# Patient Record
Sex: Female | Born: 1955 | Race: Black or African American | Hispanic: No | Marital: Married | Smoking: Former smoker
Health system: Southern US, Community
[De-identification: ages and names within clinical notes are randomized; demographics above are authoritative.]

## PROBLEM LIST (undated history)

## (undated) DIAGNOSIS — I10 Essential (primary) hypertension: Secondary | ICD-10-CM

## (undated) DIAGNOSIS — M549 Dorsalgia, unspecified: Secondary | ICD-10-CM

---

## 2002-10-04 ENCOUNTER — Encounter: Admission: RE | Admit: 2002-10-04 | Discharge: 2002-10-04 | Payer: Self-pay | Admitting: Family Medicine

## 2002-10-04 ENCOUNTER — Encounter: Payer: Self-pay | Admitting: Family Medicine

## 2003-02-12 ENCOUNTER — Ambulatory Visit (HOSPITAL_BASED_OUTPATIENT_CLINIC_OR_DEPARTMENT_OTHER): Admission: RE | Admit: 2003-02-12 | Discharge: 2003-02-12 | Payer: Self-pay | Admitting: Neurology

## 2003-12-30 ENCOUNTER — Other Ambulatory Visit: Admission: RE | Admit: 2003-12-30 | Discharge: 2003-12-30 | Payer: Self-pay | Admitting: Family Medicine

## 2004-01-13 ENCOUNTER — Encounter: Admission: RE | Admit: 2004-01-13 | Discharge: 2004-01-13 | Payer: Self-pay | Admitting: Family Medicine

## 2006-05-08 ENCOUNTER — Other Ambulatory Visit: Admission: RE | Admit: 2006-05-08 | Discharge: 2006-05-08 | Payer: Self-pay | Admitting: Family Medicine

## 2006-05-30 ENCOUNTER — Encounter: Admission: RE | Admit: 2006-05-30 | Discharge: 2006-05-30 | Payer: Self-pay | Admitting: Family Medicine

## 2007-03-14 ENCOUNTER — Encounter: Admission: RE | Admit: 2007-03-14 | Discharge: 2007-03-14 | Payer: Self-pay | Admitting: Family Medicine

## 2008-04-24 ENCOUNTER — Encounter: Admission: RE | Admit: 2008-04-24 | Discharge: 2008-04-24 | Payer: Self-pay | Admitting: Family Medicine

## 2008-09-27 ENCOUNTER — Emergency Department (HOSPITAL_COMMUNITY): Admission: EM | Admit: 2008-09-27 | Discharge: 2008-09-27 | Payer: Self-pay | Admitting: Family Medicine

## 2010-01-22 ENCOUNTER — Encounter: Admission: RE | Admit: 2010-01-22 | Discharge: 2010-01-22 | Payer: Self-pay | Admitting: Family Medicine

## 2010-07-05 LAB — POCT URINALYSIS DIP (DEVICE)
Bilirubin Urine: NEGATIVE
Glucose, UA: NEGATIVE mg/dL
Ketones, ur: NEGATIVE mg/dL
Nitrite: NEGATIVE
Protein, ur: NEGATIVE mg/dL
Specific Gravity, Urine: <=1.005 (ref 1.005–1.030)
Urobilinogen, UA: 0.2 mg/dL (ref 0.0–1.0)
pH: 6 (ref 5.0–8.0)

## 2010-07-05 LAB — WET PREP, GENITAL
Clue Cells Wet Prep HPF POC: NONE SEEN
Trich, Wet Prep: NONE SEEN
Yeast Wet Prep HPF POC: NONE SEEN

## 2010-07-05 LAB — GC/CHLAMYDIA PROBE AMP, GENITAL
Chlamydia, DNA Probe: NEGATIVE
GC Probe Amp, Genital: NEGATIVE

## 2011-01-15 ENCOUNTER — Inpatient Hospital Stay (INDEPENDENT_AMBULATORY_CARE_PROVIDER_SITE_OTHER)
Admission: RE | Admit: 2011-01-15 | Discharge: 2011-01-15 | Disposition: A | Discharge status: Home / Self Care | Payer: BC Managed Care – PPO | Source: Ambulatory Visit | Attending: Emergency Medicine | Admitting: Emergency Medicine

## 2011-01-15 DIAGNOSIS — L03019 Cellulitis of unspecified finger: Secondary | ICD-10-CM

## 2011-01-18 LAB — CULTURE, ROUTINE-ABSCESS: Gram Stain: NONE SEEN

## 2011-03-09 ENCOUNTER — Other Ambulatory Visit: Payer: Self-pay | Admitting: Diagnostic Radiology

## 2011-03-09 ENCOUNTER — Other Ambulatory Visit: Payer: Self-pay | Admitting: Family Medicine

## 2011-03-09 DIAGNOSIS — E041 Nontoxic single thyroid nodule: Secondary | ICD-10-CM

## 2011-03-10 ENCOUNTER — Ambulatory Visit
Admission: RE | Admit: 2011-03-10 | Discharge: 2011-03-10 | Disposition: A | Discharge status: Home / Self Care | Payer: BC Managed Care – PPO | Source: Ambulatory Visit | Attending: Family Medicine | Admitting: Family Medicine

## 2011-03-10 DIAGNOSIS — E041 Nontoxic single thyroid nodule: Secondary | ICD-10-CM

## 2011-09-20 ENCOUNTER — Other Ambulatory Visit: Payer: Self-pay | Admitting: Family Medicine

## 2011-09-20 DIAGNOSIS — Z1231 Encounter for screening mammogram for malignant neoplasm of breast: Secondary | ICD-10-CM

## 2011-10-11 ENCOUNTER — Ambulatory Visit
Admission: RE | Admit: 2011-10-11 | Discharge: 2011-10-11 | Disposition: A | Discharge status: Home / Self Care | Payer: BC Managed Care – PPO | Source: Ambulatory Visit | Attending: Family Medicine | Admitting: Family Medicine

## 2011-10-11 DIAGNOSIS — Z1231 Encounter for screening mammogram for malignant neoplasm of breast: Secondary | ICD-10-CM

## 2011-11-27 ENCOUNTER — Emergency Department (HOSPITAL_COMMUNITY)
Admission: EM | Admit: 2011-11-27 | Discharge: 2011-11-27 | Disposition: A | Discharge status: Home / Self Care | Payer: BC Managed Care – PPO | Source: Home / Self Care | Attending: Family Medicine | Admitting: Family Medicine

## 2011-11-27 ENCOUNTER — Encounter (HOSPITAL_COMMUNITY): Payer: Self-pay | Admitting: *Deleted

## 2011-11-27 DIAGNOSIS — J020 Streptococcal pharyngitis: Secondary | ICD-10-CM

## 2011-11-27 HISTORY — DX: Dorsalgia, unspecified: M54.9

## 2011-11-27 HISTORY — DX: Essential (primary) hypertension: I10

## 2011-11-27 LAB — POCT RAPID STREP A: Streptococcus, Group A Screen (Direct): POSITIVE — AB

## 2011-11-27 MED ORDER — CEFDINIR 300 MG PO CAPS: 300.0000 mg | ORAL_CAPSULE | Freq: Two times a day (BID) | ORAL | Status: AC

## 2011-11-27 NOTE — ED Notes (Signed)
Reports feeling feverish on Friday; Sat AM woke up with sore throat which has progressively gotten worse.  Also c/o nasal congestion and right ear congestion.  Has been taking Advil Cold & Sinus - last dose @ 0600 this AM.  Tonsils red and swollen.  No exudate noted.

## 2011-11-27 NOTE — ED Provider Notes (Signed)
History     CSN: [id number]  Arrival date & time 11/27/11  0946   First MD Initiated Contact with Patient 11/27/11 [phone]      Chief Complaint  Patient presents with  . Sore Throat  . Fever  . Nasal Congestion    (Consider location/radiation/quality/duration/timing/severity/associated sxs/prior treatment) Patient is a 56 y.o. female presenting with pharyngitis and fever. The history is provided by the patient.  Sore Throat This is a new problem. The current episode started 2 days ago. The problem has been gradually worsening. The symptoms are aggravated by swallowing.  Fever Primary symptoms of the febrile illness include fever. Primary symptoms do not include cough.    Past Medical History  Diagnosis Date  . Hypertension   . Back pain     Past Surgical History  Procedure Date  . Cesarean section     x2    No family history on file.  History  Substance Use Topics  . Smoking status: Former Games developer  . Smokeless tobacco: Not on file  . Alcohol Use: No    OB History    Grav Para Term Preterm Abortions TAB SAB Ect Mult Living                  Review of Systems  Constitutional: Positive for fever, chills and appetite change.  HENT: Positive for congestion and sore throat. Negative for ear pain.   Respiratory: Negative for cough.   Gastrointestinal: Negative.   Musculoskeletal: Negative.   Skin: Negative.     Allergies  Erythromycin and Penicillins  Home Medications   Current Outpatient Rx  Name Route Sig Dispense Refill  . ATENOLOL PO Oral Take by mouth daily.    Marland Kitchen DILTIAZEM HCL PO Oral Take by mouth daily.    . MELOXICAM PO Oral Take by mouth.    . CEFDINIR 300 MG PO CAPS Oral Take 1 capsule (300 mg total) by mouth 2 (two) times daily. 20 capsule 0    BP 137/66  Pulse 100  Temp 99.6 F (37.6 C) (Oral)  Resp 20  SpO2 100%  Physical Exam  Nursing note and vitals reviewed. Constitutional: She is oriented to person, place, and time. She appears  well-developed and well-nourished.  HENT:  Head: Normocephalic.  Right Ear: External ear normal.  Left Ear: External ear normal.  Mouth/Throat: Mucous membranes are normal. Oropharyngeal exudate and posterior oropharyngeal erythema present.  Neck: Normal range of motion. Neck supple.  Pulmonary/Chest: Breath sounds normal.  Lymphadenopathy:    She has cervical adenopathy.  Neurological: She is alert and oriented to person, place, and time.  Skin: Skin is warm and dry.    ED Course  Procedures (including critical care time)  Labs Reviewed  POCT RAPID STREP A ([facility] ONLY) - Abnormal; Notable for the following:    Streptococcus, Group A Screen (Direct) POSITIVE (*)     All other components within normal limits   No results found.   1. Strep sore throat       MDM  Strep --pos.        Linna Hoff, MD 11/27/11 1023

## 2011-11-28 ENCOUNTER — Telehealth (HOSPITAL_COMMUNITY): Payer: Self-pay | Admitting: *Deleted

## 2011-11-28 NOTE — ED Notes (Signed)
Pt.'s daughter called and said nothing is helping the sore throat pain.  Discussed treatment options like warm salt water gargles, Chloraseptic spray or lozengers.  She said she has been doing that and taking pain pills for the past few days without relief.  She wants a Rx. of something.  I told her I would talk to the doctor and call back. Discussed with Dr. Artis Flock and he said there is nothing else he can give. He said she can use Chloraseptic and it will improve in a few days.  I called pt.'s daughter back and gave her this information. I told her she can alternate Tylenol and Ibuprofen every 4 hrs for pain and fever. I told her the antibiotics will start working in 48-72 hrs and her symptoms will improve.  She voiced understanding. Vassie Moselle 11/28/2011

## 2012-09-20 ENCOUNTER — Other Ambulatory Visit (HOSPITAL_COMMUNITY)
Admission: RE | Admit: 2012-09-20 | Discharge: 2012-09-20 | Disposition: A | Discharge status: Home / Self Care | Payer: BC Managed Care – PPO | Source: Ambulatory Visit | Attending: Family Medicine | Admitting: Family Medicine

## 2012-09-20 ENCOUNTER — Other Ambulatory Visit: Payer: Self-pay | Admitting: Family Medicine

## 2012-09-20 DIAGNOSIS — Z01419 Encounter for gynecological examination (general) (routine) without abnormal findings: Secondary | ICD-10-CM | POA: Insufficient documentation

## 2012-12-21 ENCOUNTER — Other Ambulatory Visit: Payer: Self-pay

## 2013-04-14 ENCOUNTER — Emergency Department (HOSPITAL_COMMUNITY)
Admission: EM | Admit: 2013-04-14 | Discharge: 2013-04-14 | Disposition: A | Discharge status: Home / Self Care | Payer: BC Managed Care – PPO | Source: Home / Self Care | Attending: Emergency Medicine | Admitting: Emergency Medicine

## 2013-04-14 ENCOUNTER — Encounter (HOSPITAL_COMMUNITY): Payer: Self-pay | Admitting: Emergency Medicine

## 2013-04-14 DIAGNOSIS — J069 Acute upper respiratory infection, unspecified: Secondary | ICD-10-CM

## 2013-04-14 MED ORDER — IPRATROPIUM BROMIDE 0.06 % NA SOLN: 2.0000 | Freq: Four times a day (QID) | NASAL | Status: DC

## 2013-04-14 MED ORDER — DEXTROMETHORPHAN POLISTIREX 30 MG/5ML PO LQCR: 30.0000 mg | Freq: Two times a day (BID) | ORAL | Status: DC | PRN

## 2013-04-14 NOTE — ED Notes (Signed)
Pt  Reports  Symptoms  Of     Sinus   Congestion  /  Pressure          With    Blood  Tinged    Drainage        And  A   For  The most  Part  Non  Productive  Cough  As  Well    -  Pt  Reports  The  Symptoms  X  5  Days

## 2013-04-14 NOTE — ED Provider Notes (Signed)
Medical screening examination/treatment/procedure(s) were performed by non-physician practitioner and as supervising physician I was immediately available for consultation/collaboration.  Leslee Homeavid Gracy Ehly, M.D.  Reuben Likesavid C Saarah Dewing, MD 04/14/13 2133

## 2013-04-14 NOTE — ED Provider Notes (Signed)
CSN: [id number]     Arrival date & time 04/14/13  1010 History   First MD Initiated Contact with Patient 04/14/13 1211     Chief Complaint  Patient presents with  . URI   (Consider location/radiation/quality/duration/timing/severity/associated sxs/prior Treatment) HPI Comments: Using multiple OTC "cold and sinus" products with only some relief.  PCP: Deboraha SprangEagle @ [facility]  Patient is a 58 y.o. female presenting with URI. The history is provided by the patient.  URI Presenting symptoms: congestion, cough and rhinorrhea   Presenting symptoms: no fever and no sore throat   Severity:  Mild Onset quality:  Gradual Duration:  7 days Timing:  Constant Progression:  Unchanged Chronicity:  New Associated symptoms: sneezing   Associated symptoms: no headaches, no myalgias, no neck pain, no sinus pain, no swollen glands and no wheezing     Past Medical History  Diagnosis Date  . Hypertension   . Back pain    Past Surgical History  Procedure Laterality Date  . Cesarean section      x2   History reviewed. No pertinent family history. History  Substance Use Topics  . Smoking status: Former Games developermoker  . Smokeless tobacco: Not on file  . Alcohol Use: No   OB History   Grav Para Term Preterm Abortions TAB SAB Ect Mult Living                 Review of Systems  Constitutional: Negative for fever.  HENT: Positive for congestion, rhinorrhea and sneezing. Negative for sore throat.   Eyes: Negative.   Respiratory: Positive for cough. Negative for chest tightness, shortness of breath and wheezing.   Cardiovascular: Negative.   Gastrointestinal: Negative.   Endocrine: Negative for polydipsia, polyphagia and polyuria.  Genitourinary: Negative.   Musculoskeletal: Negative for myalgias, neck pain and neck stiffness.  Skin: Negative.   Allergic/Immunologic: Negative for immunocompromised state.  Neurological: Negative for dizziness, weakness, light-headedness and headaches.  Hematological:  Negative for adenopathy.    Allergies  Erythromycin and Penicillins  Home Medications   Current Outpatient Rx  Name  Route  Sig  Dispense  Refill  . ATENOLOL PO   Oral   Take by mouth daily.         Marland Kitchen. DILTIAZEM HCL PO   Oral   Take by mouth daily.         . MELOXICAM PO   Oral   Take by mouth.          BP 224/92  Pulse 68  Temp(Src) 97.8 F (36.6 C) (Oral)  Resp 19  SpO2 97% Physical Exam  Nursing note and vitals reviewed. Constitutional: She is oriented to person, place, and time. She appears well-developed and well-nourished.  HENT:  Head: Normocephalic and atraumatic.  Right Ear: Hearing, tympanic membrane, external ear and ear canal normal.  Left Ear: Hearing, tympanic membrane, external ear and ear canal normal.  Nose: Nose normal.  Mouth/Throat: Uvula is midline, oropharynx is clear and moist and mucous membranes are normal.  Eyes: Conjunctivae are normal. Right eye exhibits no discharge. Left eye exhibits no discharge. No scleral icterus.  Neck: Normal range of motion. Neck supple.  Cardiovascular: Normal rate, regular rhythm and normal heart sounds.   Pulmonary/Chest: Effort normal and breath sounds normal. No respiratory distress. She has no wheezes.  Musculoskeletal: Normal range of motion. She exhibits no edema.  Lymphadenopathy:    She has no cervical adenopathy.  Neurological: She is alert and oriented to person, place, and time.  Skin: Skin is warm and dry. No rash noted.  Psychiatric: She has a normal mood and affect. Her behavior is normal.    ED Course  Procedures (including critical care time) Labs Review Labs Reviewed - No data to display Imaging Review No results found.  EKG Interpretation    Date/Time:    Ventricular Rate:    PR Interval:    QRS Duration:   QT Interval:    QTC Calculation:   R Axis:     Text Interpretation:              MDM  Exam unremarkable and without evidence of acute sinusitis.  Will advise  against using OTC decongestant as these are likely contributing to elevated BP. Will treat with Delsym for cough and Atrovent nasal spray for congestion and advise PCP follow up if no improvement.    Jess Barters [place], [place] 04/14/13 1242  Jess Barters [place], [place] 04/14/13 [phone]

## 2014-01-01 ENCOUNTER — Other Ambulatory Visit: Payer: Self-pay

## 2014-01-01 DIAGNOSIS — Z1239 Encounter for other screening for malignant neoplasm of breast: Secondary | ICD-10-CM

## 2014-01-14 ENCOUNTER — Encounter (INDEPENDENT_AMBULATORY_CARE_PROVIDER_SITE_OTHER): Payer: Self-pay

## 2014-01-14 ENCOUNTER — Ambulatory Visit
Admission: RE | Admit: 2014-01-14 | Discharge: 2014-01-14 | Disposition: A | Discharge status: Home / Self Care | Payer: BC Managed Care – PPO | Source: Ambulatory Visit

## 2014-01-14 DIAGNOSIS — Z1239 Encounter for other screening for malignant neoplasm of breast: Secondary | ICD-10-CM

## 2015-10-26 ENCOUNTER — Other Ambulatory Visit (HOSPITAL_COMMUNITY)
Admission: RE | Admit: 2015-10-26 | Discharge: 2015-10-26 | Disposition: A | Discharge status: Home / Self Care | Payer: Managed Care, Other (non HMO) | Source: Ambulatory Visit | Attending: Family Medicine | Admitting: Family Medicine

## 2015-10-26 ENCOUNTER — Other Ambulatory Visit: Payer: Self-pay | Admitting: Family Medicine

## 2015-10-26 DIAGNOSIS — Z124 Encounter for screening for malignant neoplasm of cervix: Secondary | ICD-10-CM | POA: Diagnosis present

## 2015-10-26 DIAGNOSIS — Z1151 Encounter for screening for human papillomavirus (HPV): Secondary | ICD-10-CM | POA: Diagnosis not present

## 2015-10-29 LAB — CYTOLOGY - PAP

## 2015-11-10 ENCOUNTER — Other Ambulatory Visit: Payer: Self-pay | Admitting: Family Medicine

## 2015-11-10 DIAGNOSIS — Z1231 Encounter for screening mammogram for malignant neoplasm of breast: Secondary | ICD-10-CM

## 2015-11-17 ENCOUNTER — Ambulatory Visit
Admission: RE | Admit: 2015-11-17 | Discharge: 2015-11-17 | Disposition: A | Discharge status: Home / Self Care | Payer: Managed Care, Other (non HMO) | Source: Ambulatory Visit | Attending: Family Medicine | Admitting: Family Medicine

## 2015-11-17 DIAGNOSIS — Z1231 Encounter for screening mammogram for malignant neoplasm of breast: Secondary | ICD-10-CM

## 2017-05-11 ENCOUNTER — Other Ambulatory Visit: Payer: Self-pay | Admitting: Family Medicine

## 2017-05-11 DIAGNOSIS — Z139 Encounter for screening, unspecified: Secondary | ICD-10-CM

## 2017-05-31 ENCOUNTER — Ambulatory Visit: Payer: Managed Care, Other (non HMO)

## 2017-07-18 ENCOUNTER — Ambulatory Visit
Admission: RE | Admit: 2017-07-18 | Discharge: 2017-07-18 | Disposition: A | Discharge status: Home / Self Care | Payer: Managed Care, Other (non HMO) | Source: Ambulatory Visit | Attending: Family Medicine | Admitting: Family Medicine

## 2017-07-18 DIAGNOSIS — Z139 Encounter for screening, unspecified: Secondary | ICD-10-CM

## 2017-07-20 ENCOUNTER — Other Ambulatory Visit: Payer: Self-pay | Admitting: Family Medicine

## 2017-07-20 DIAGNOSIS — R921 Mammographic calcification found on diagnostic imaging of breast: Secondary | ICD-10-CM

## 2017-07-24 ENCOUNTER — Ambulatory Visit
Admission: RE | Admit: 2017-07-24 | Discharge: 2017-07-24 | Disposition: A | Discharge status: Home / Self Care | Payer: Managed Care, Other (non HMO) | Source: Ambulatory Visit | Attending: Family Medicine | Admitting: Family Medicine

## 2017-07-24 ENCOUNTER — Other Ambulatory Visit: Payer: Self-pay | Admitting: Family Medicine

## 2017-07-24 DIAGNOSIS — R921 Mammographic calcification found on diagnostic imaging of breast: Secondary | ICD-10-CM

## 2017-11-23 ENCOUNTER — Other Ambulatory Visit: Payer: Self-pay | Admitting: Family Medicine

## 2017-11-23 DIAGNOSIS — N63 Unspecified lump in unspecified breast: Secondary | ICD-10-CM

## 2017-11-28 ENCOUNTER — Ambulatory Visit
Admission: RE | Admit: 2017-11-28 | Discharge: 2017-11-28 | Disposition: A | Discharge status: Home / Self Care | Payer: No Typology Code available for payment source | Source: Ambulatory Visit | Attending: Family Medicine | Admitting: Family Medicine

## 2017-11-28 ENCOUNTER — Ambulatory Visit: Payer: Managed Care, Other (non HMO)

## 2017-11-28 DIAGNOSIS — N63 Unspecified lump in unspecified breast: Secondary | ICD-10-CM

## 2018-09-04 ENCOUNTER — Other Ambulatory Visit: Payer: Self-pay | Admitting: *Deleted

## 2018-09-04 DIAGNOSIS — Z20822 Contact with and (suspected) exposure to covid-19: Secondary | ICD-10-CM

## 2018-09-13 NOTE — Addendum Note (Signed)
Addended by: Brigitte Pulse on: 09/13/2018 11:52 AM   Modules accepted: Orders

## 2018-09-17 ENCOUNTER — Other Ambulatory Visit: Payer: Self-pay | Admitting: *Deleted

## 2018-09-17 DIAGNOSIS — Z20822 Contact with and (suspected) exposure to covid-19: Secondary | ICD-10-CM

## 2018-09-20 NOTE — Addendum Note (Signed)
Addended by: Niccolo Burggraf M on: 09/20/2018 03:52 PM   Modules accepted: Orders  

## 2019-02-27 IMAGING — MG DIGITAL DIAGNOSTIC UNILATERAL LEFT MAMMOGRAM
3 series · 3 of 3 positions shown · non-contrast
Comparison: Previous exam(s).

CLINICAL DATA: 61-year-old female for further evaluation of LEFT
breast calcifications on screening mammogram.

EXAM:
DIGITAL DIAGNOSTIC LEFT MAMMOGRAM WITH CAD

[L CC]
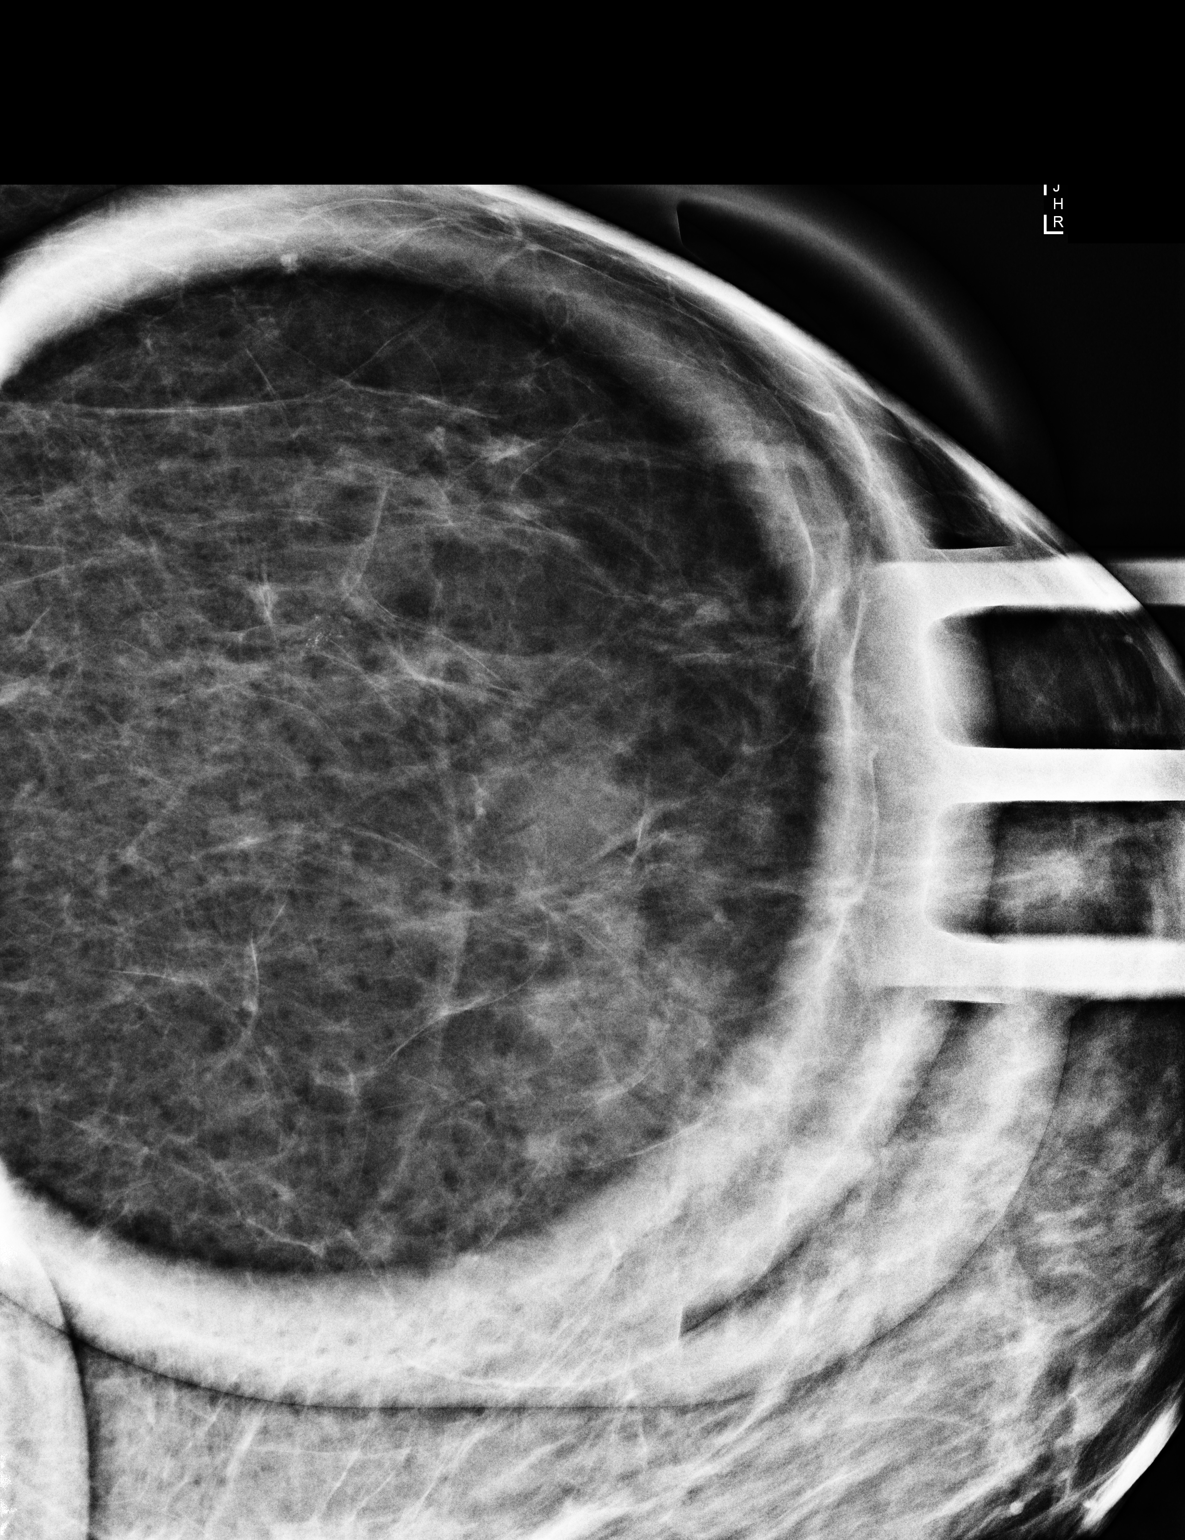

[L ML (1 of 2)]
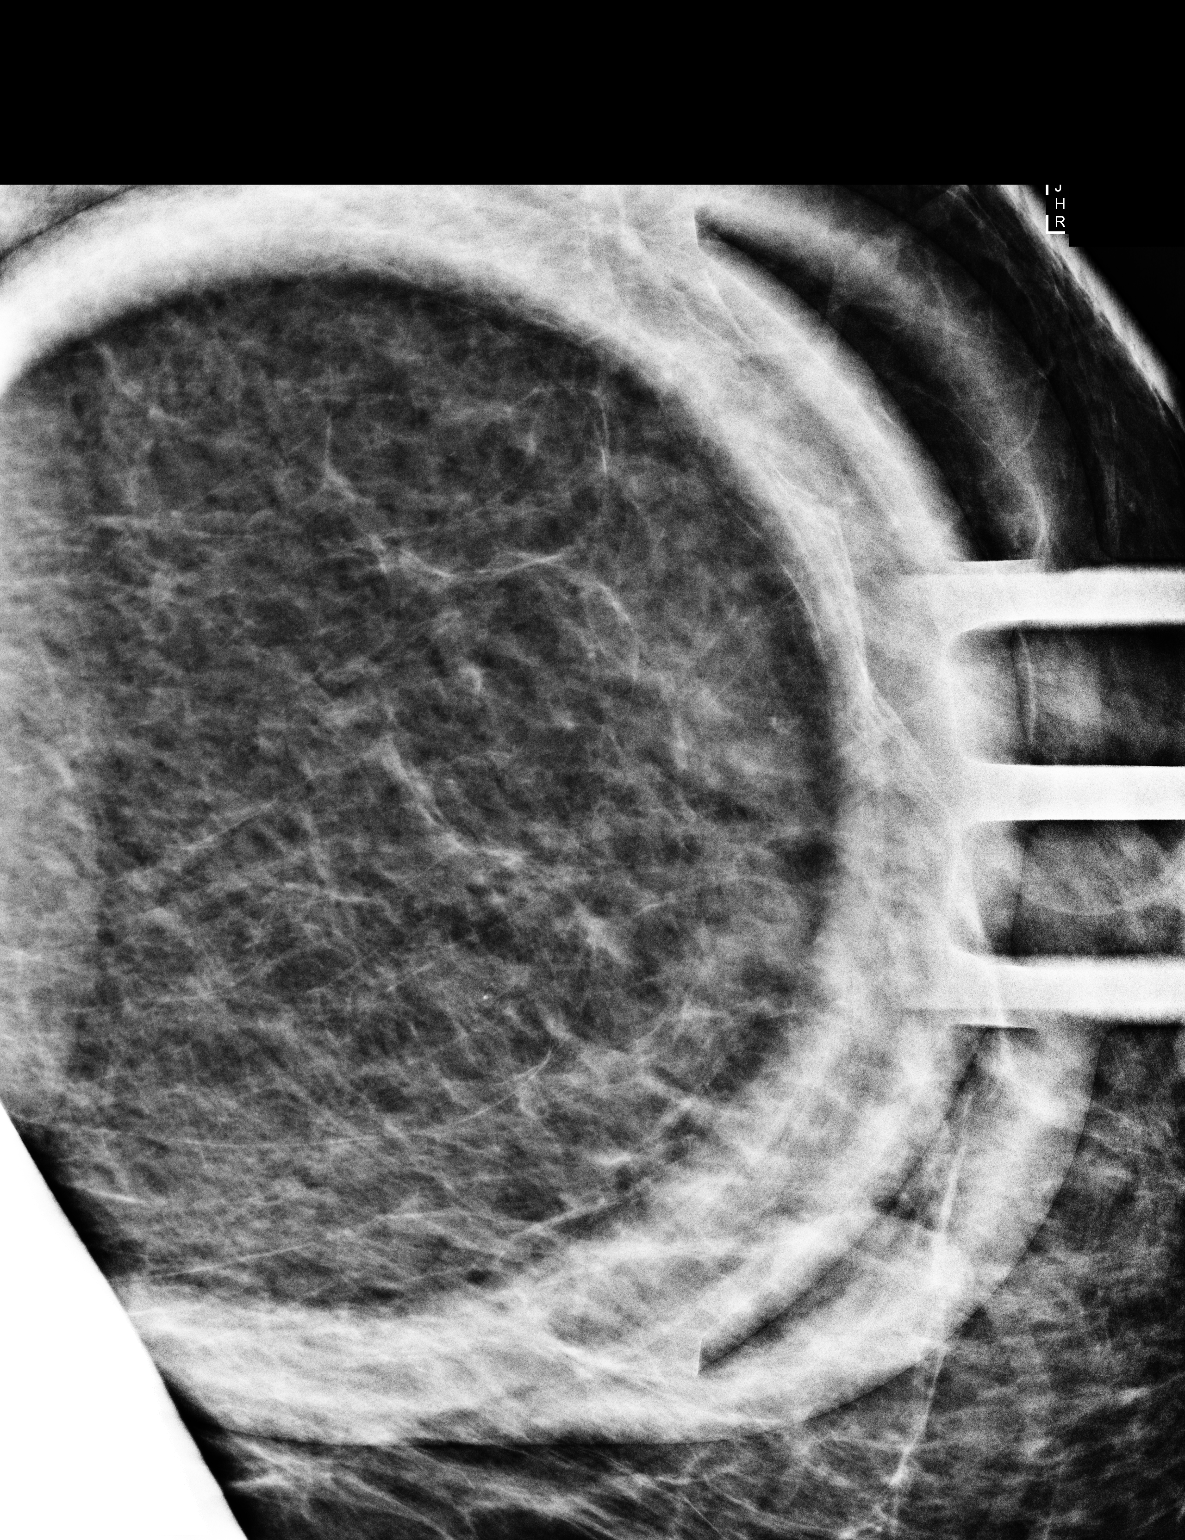

[L ML (2 of 2)]
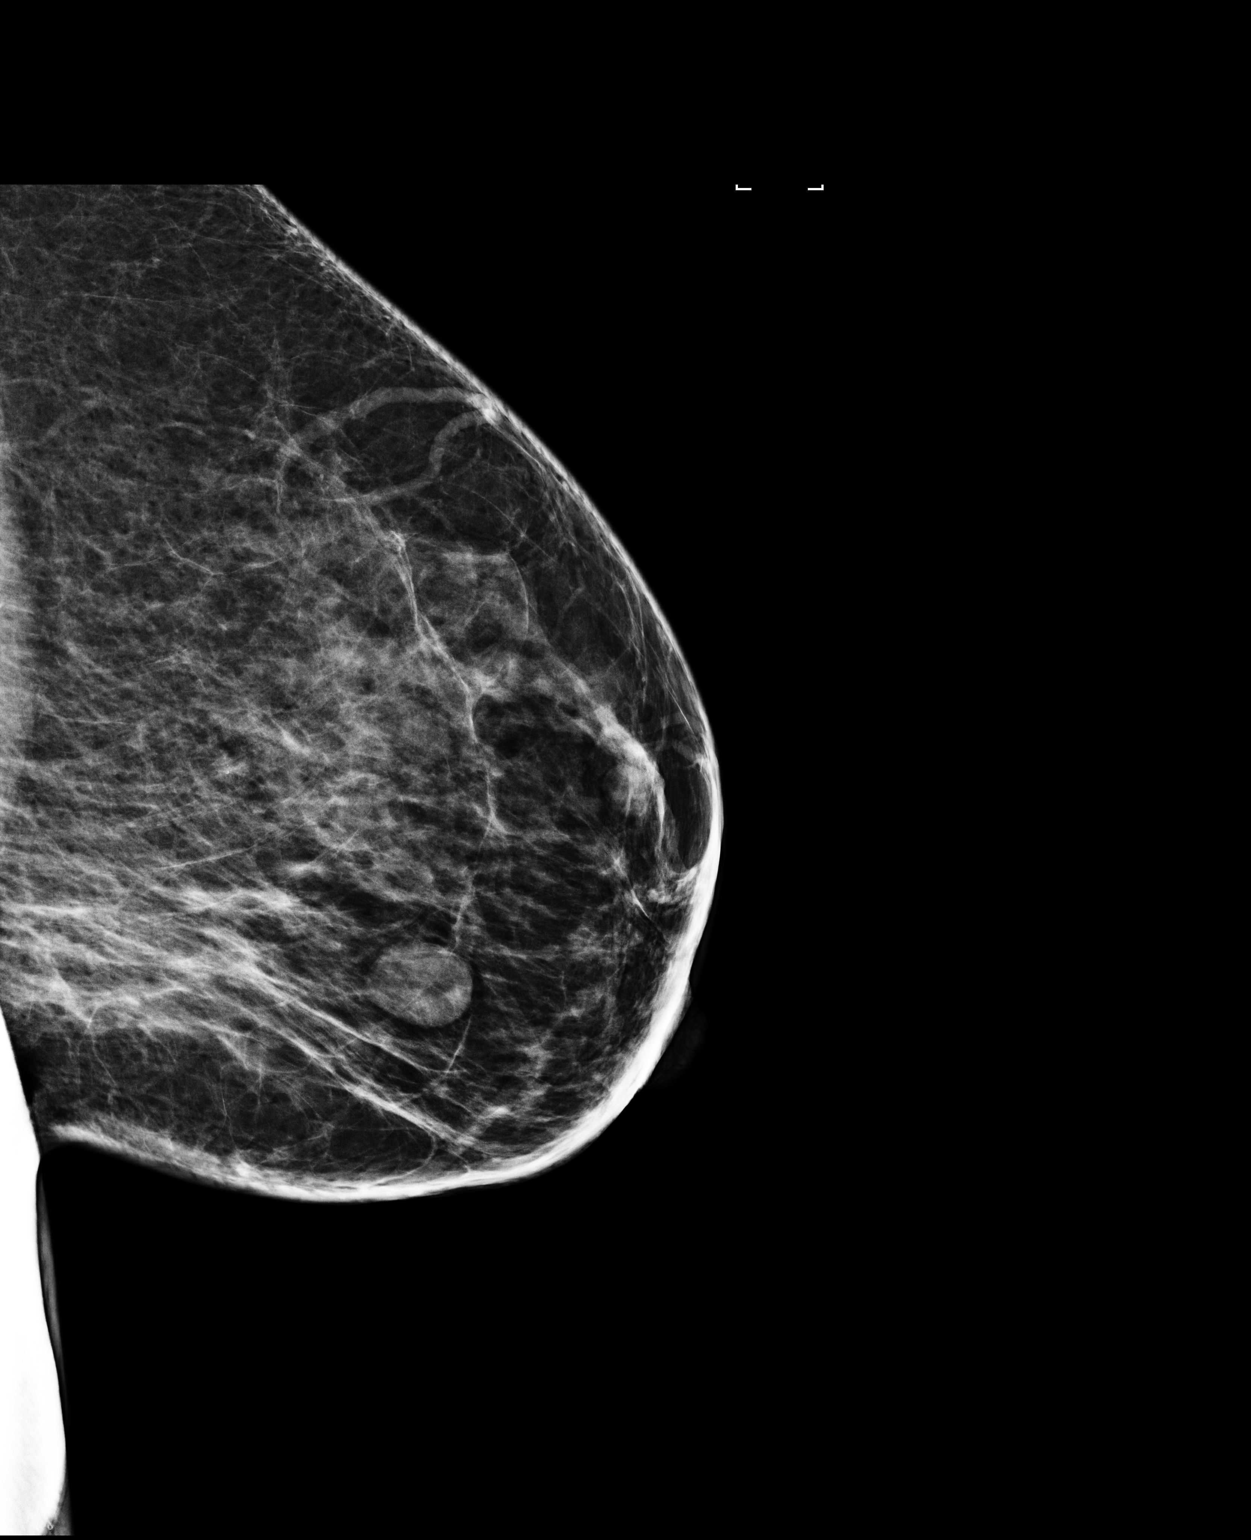

[3 of 3 positions shown; findings below may reference images not displayed]

ACR Breast Density Category b: There are scattered areas of
fibroglandular density.
FINDINGS: Full field and magnification views of the LEFT breast demonstrate a
4 mm group of primarily round calcifications within the OUTER LEFT
breast. No associated mass or distortion identified.

Mammographic images were processed with CAD.
IMPRESSION: Likely benign calcifications within the OUTER LEFT breast. Six-month
follow-up recommended to ensure stability.

RECOMMENDATION:
LEFT diagnostic mammograms with magnification views in 6 months.

I have discussed the findings and recommendations with the patient.
Results were also provided in writing at the conclusion of the
visit. If applicable, a reminder letter will be sent to the patient
regarding the next appointment.

BI-RADS CATEGORY  3: Probably benign.

## 2020-01-25 ENCOUNTER — Ambulatory Visit: Payer: Self-pay | Attending: Internal Medicine

## 2020-01-25 DIAGNOSIS — Z23 Encounter for immunization: Secondary | ICD-10-CM

## 2020-01-25 NOTE — Progress Notes (Signed)
   Covid-19 Vaccination Clinic  Name:  Nichole Bradley    MRN: [id number] DOB: October 07, 1955  01/25/2020  [name] was observed post Covid-19 immunization for 15 minutes without incident. She was provided with Vaccine Information Sheet and instruction to access the V-Safe system.   [name] was instructed to call 911 with any severe reactions post vaccine: Marland Kitchen Difficulty breathing  . Swelling of face and throat  . A fast heartbeat  . A bad rash all over body  . Dizziness and weakness

## 2020-03-12 ENCOUNTER — Other Ambulatory Visit: Payer: Self-pay | Admitting: Family Medicine

## 2020-03-12 DIAGNOSIS — Z1231 Encounter for screening mammogram for malignant neoplasm of breast: Secondary | ICD-10-CM

## 2020-03-13 ENCOUNTER — Other Ambulatory Visit: Payer: Self-pay | Admitting: Family Medicine

## 2020-03-13 DIAGNOSIS — R921 Mammographic calcification found on diagnostic imaging of breast: Secondary | ICD-10-CM

## 2020-04-22 ENCOUNTER — Other Ambulatory Visit: Payer: Self-pay

## 2020-04-22 ENCOUNTER — Ambulatory Visit
Admission: RE | Admit: 2020-04-22 | Discharge: 2020-04-22 | Disposition: A | Discharge status: Home / Self Care | Payer: Medicare Other | Source: Ambulatory Visit | Attending: Family Medicine | Admitting: Family Medicine

## 2020-04-22 DIAGNOSIS — R921 Mammographic calcification found on diagnostic imaging of breast: Secondary | ICD-10-CM

## 2020-04-22 DIAGNOSIS — R922 Inconclusive mammogram: Secondary | ICD-10-CM | POA: Diagnosis not present

## 2020-06-11 DIAGNOSIS — H40013 Open angle with borderline findings, low risk, bilateral: Secondary | ICD-10-CM | POA: Diagnosis not present

## 2020-09-09 DIAGNOSIS — R7309 Other abnormal glucose: Secondary | ICD-10-CM | POA: Diagnosis not present

## 2020-09-09 DIAGNOSIS — Z23 Encounter for immunization: Secondary | ICD-10-CM | POA: Diagnosis not present

## 2020-09-09 DIAGNOSIS — I10 Essential (primary) hypertension: Secondary | ICD-10-CM | POA: Diagnosis not present

## 2020-09-09 DIAGNOSIS — E7800 Pure hypercholesterolemia, unspecified: Secondary | ICD-10-CM | POA: Diagnosis not present

## 2020-12-10 DIAGNOSIS — H40013 Open angle with borderline findings, low risk, bilateral: Secondary | ICD-10-CM | POA: Diagnosis not present

## 2021-03-18 DIAGNOSIS — Z1389 Encounter for screening for other disorder: Secondary | ICD-10-CM | POA: Diagnosis not present

## 2021-03-18 DIAGNOSIS — R7309 Other abnormal glucose: Secondary | ICD-10-CM | POA: Diagnosis not present

## 2021-03-18 DIAGNOSIS — I10 Essential (primary) hypertension: Secondary | ICD-10-CM | POA: Diagnosis not present

## 2021-03-18 DIAGNOSIS — E7800 Pure hypercholesterolemia, unspecified: Secondary | ICD-10-CM | POA: Diagnosis not present

## 2021-03-18 DIAGNOSIS — Z23 Encounter for immunization: Secondary | ICD-10-CM | POA: Diagnosis not present

## 2021-03-18 DIAGNOSIS — Z0000 Encounter for general adult medical examination without abnormal findings: Secondary | ICD-10-CM | POA: Diagnosis not present

## 2021-06-01 DIAGNOSIS — H1045 Other chronic allergic conjunctivitis: Secondary | ICD-10-CM | POA: Diagnosis not present

## 2021-07-23 DIAGNOSIS — M1711 Unilateral primary osteoarthritis, right knee: Secondary | ICD-10-CM | POA: Diagnosis not present

## 2021-07-23 DIAGNOSIS — M1712 Unilateral primary osteoarthritis, left knee: Secondary | ICD-10-CM | POA: Diagnosis not present

## 2021-07-23 DIAGNOSIS — M170 Bilateral primary osteoarthritis of knee: Secondary | ICD-10-CM | POA: Diagnosis not present

## 2021-09-16 DIAGNOSIS — R634 Abnormal weight loss: Secondary | ICD-10-CM | POA: Diagnosis not present

## 2021-09-16 DIAGNOSIS — I10 Essential (primary) hypertension: Secondary | ICD-10-CM | POA: Diagnosis not present

## 2021-09-16 DIAGNOSIS — R7309 Other abnormal glucose: Secondary | ICD-10-CM | POA: Diagnosis not present

## 2021-09-16 DIAGNOSIS — E7800 Pure hypercholesterolemia, unspecified: Secondary | ICD-10-CM | POA: Diagnosis not present

## 2021-09-16 DIAGNOSIS — Z86010 Personal history of colonic polyps: Secondary | ICD-10-CM | POA: Diagnosis not present

## 2021-11-16 ENCOUNTER — Other Ambulatory Visit: Payer: Self-pay | Admitting: *Deleted

## 2021-11-16 DIAGNOSIS — R6889 Other general symptoms and signs: Secondary | ICD-10-CM

## 2021-11-25 ENCOUNTER — Encounter: Payer: Self-pay | Admitting: Vascular Surgery

## 2021-11-25 ENCOUNTER — Ambulatory Visit (HOSPITAL_COMMUNITY)
Admission: RE | Admit: 2021-11-25 | Discharge: 2021-11-25 | Disposition: A | Discharge status: Home / Self Care | Payer: Medicare Other | Source: Ambulatory Visit | Attending: Vascular Surgery | Admitting: Vascular Surgery

## 2021-11-25 ENCOUNTER — Ambulatory Visit: Payer: Medicare Other | Admitting: Vascular Surgery

## 2021-11-25 VITALS — BP 156/74 | HR 60 | Temp 97.9°F | Resp 20 | Ht 61.5 in | Wt 179.0 lb

## 2021-11-25 DIAGNOSIS — R6889 Other general symptoms and signs: Secondary | ICD-10-CM | POA: Diagnosis not present

## 2021-11-25 DIAGNOSIS — I739 Peripheral vascular disease, unspecified: Secondary | ICD-10-CM | POA: Diagnosis not present

## 2021-11-25 NOTE — Progress Notes (Signed)
ASSESSMENT & PLAN   ABNORMAL NONINVASIVE STUDY: This patient had a abnormal noninvasive study for peripheral arterial disease done by the home care team.  However our studies today show no evidence of arterial insufficiency on her Doppler study.  In addition I can palpate pedal pulses.  She has no symptoms of peripheral arterial disease.  I simply encouraged her to stay as active as possible.  I have encouraged her to try to wean herself off the Nicorette gum.  We also discussed the importance of nutrition.  I be happy to see her back at any time if any new vascular issues arise.  REASON FOR CONSULT:    Abnormal ABI.  The consult is requested by Dr. Blair Heys.   HPI:   Nichole Bradley is a 66 y.o. female who had a screening peripheral arterial disease test on 07/20/2021.  This was reportedly abnormal and the patient is sent for vascular consultation.  On my history, the patient denies any history of claudication, rest pain, or nonhealing ulcers.  Her risk factors for peripheral arterial disease include hypertension and tobacco use.  She had smoked 1 pack/day to 1-1/2 packs/day for many years.  She got off the cigarettes about 4 to 5 years ago but does continue to use Nicorette gum.  She denies any history of diabetes, hypercholesterolemia, or family history of premature cardiovascular disease.  Past Medical History:  Diagnosis Date   Back pain    Hypertension     Family History  Problem Relation Age of Onset   Breast cancer Maternal Grandmother     SOCIAL HISTORY: Social History   Tobacco Use   Smoking status: Former   Smokeless tobacco: Not on file  Substance Use Topics   Alcohol use: No    Allergies  Allergen Reactions   Erythromycin     Gastroenteritis   Penicillins     Syncope    Current Outpatient Medications  Medication Sig Dispense Refill   atenolol-chlorthalidone (TENORETIC) 100-25 MG tablet Take 1 tablet by mouth daily.     diltiazem (CARDIZEM CD) 180  MG 24 hr capsule Take 180 mg by mouth daily.     doxazosin (CARDURA) 2 MG tablet Take 2 mg by mouth daily.     No current facility-administered medications for this visit.    REVIEW OF SYSTEMS:  [X]  denotes positive finding, [ ]  denotes negative finding Cardiac  Comments:  Chest pain or chest pressure:    Shortness of breath upon exertion:    Short of breath when lying flat:    Irregular heart rhythm:        Vascular    Pain in calf, thigh, or hip brought on by ambulation:    Pain in feet at night that wakes you up from your sleep:     Blood clot in your veins:    Leg swelling:         Pulmonary    Oxygen at home:    Productive cough:     Wheezing:         Neurologic    Sudden weakness in arms or legs:     Sudden numbness in arms or legs:     Sudden onset of difficulty speaking or slurred speech:    Temporary loss of vision in one eye:     Problems with dizziness:         Gastrointestinal    Blood in stool:     Vomited blood:  Genitourinary    Burning when urinating:     Blood in urine:        Psychiatric    Major depression:         Hematologic    Bleeding problems:    Problems with blood clotting too easily:        Skin    Rashes or ulcers:        Constitutional    Fever or chills:    -  PHYSICAL EXAM:   Vitals:   11/25/21 1539  BP: (!) 156/74  Pulse: 60  Resp: 20  Temp: 97.9 F (36.6 C)  SpO2: 96%  Weight: 179 lb (81.2 kg)  Height: 5' 1.5" (1.562 m)   Body mass index is 33.27 kg/m. GENERAL: The patient is a well-nourished female, in no acute distress. The vital signs are documented above. CARDIAC: There is a regular rate and rhythm.  VASCULAR: I do not detect carotid bruits. She has palpable radial pulses bilaterally. She has palpable femoral, popliteal, and dorsalis pedis pulses bilaterally. PULMONARY: There is good air exchange bilaterally without wheezing or rales. ABDOMEN: Soft and non-tender with normal pitched bowel sounds.   MUSCULOSKELETAL: There are no major deformities. NEUROLOGIC: No focal weakness or paresthesias are detected. SKIN: There are no ulcers or rashes noted. PSYCHIATRIC: The patient has a normal affect.  DATA:    ARTERIAL DOPPLER STUDY: I have independently interpreted her arterial Doppler study today.  On the right side there is a multiphasic posterior tibial signal with a monophasic dorsalis pedis signal.  ABI is 96%.  Toe pressures 104 mmHg.  On the left side there is a biphasic dorsalis pedis and posterior tibial signal.  ABIs 100%.  Toe pressures 100 mmHg.  Waverly Ferrari [facility]

## 2021-11-26 IMAGING — MG DIGITAL DIAGNOSTIC BILAT W/ TOMO W/ CAD
6 of 10 series · 6 of 26 positions shown · non-contrast
Comparison: Previous exam(s).

CLINICAL DATA: 2+ year follow-up of probable benign calcifications
in the left breast seen on diagnostic mammogram dated 07/18/2017.

EXAM:
DIGITAL DIAGNOSTIC BILATERAL MAMMOGRAM WITH TOMO AND CAD
TECHNIQUE: Bilateral digital diagnostic mammography and breast tomosynthesis
was performed. Digital images of the breasts were evaluated with
computer-aided detection.

[L ML]
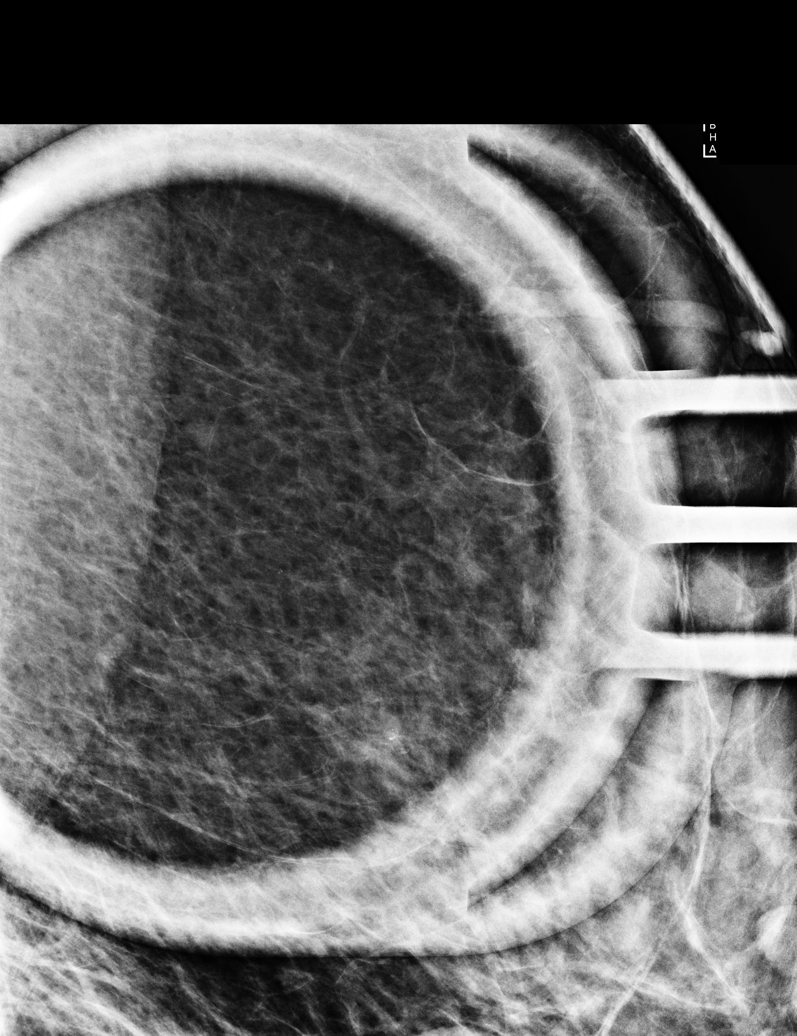

[L CC]
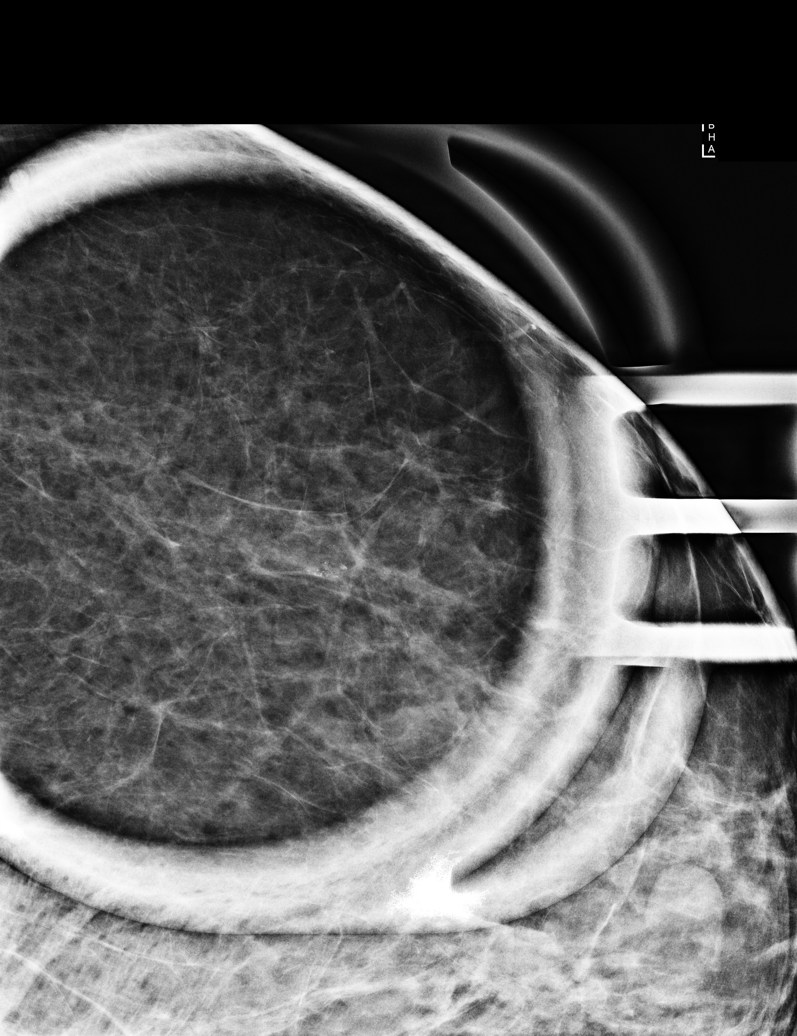

[R MLO synth-2D]
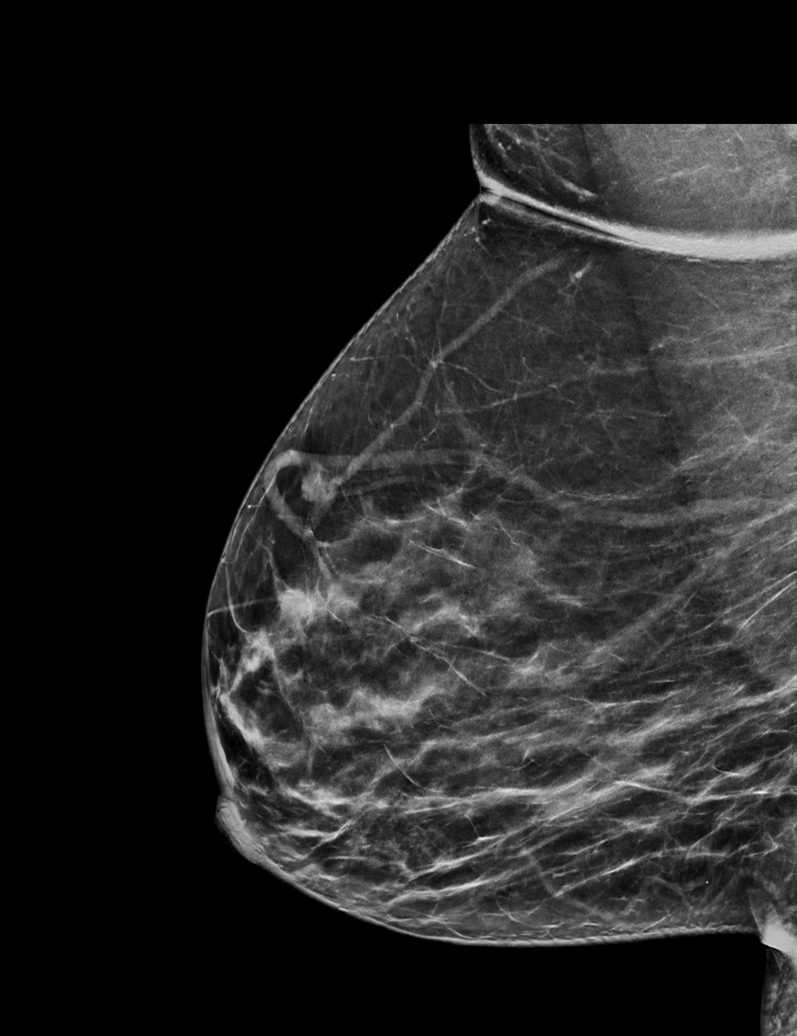

[R CC synth-2D]
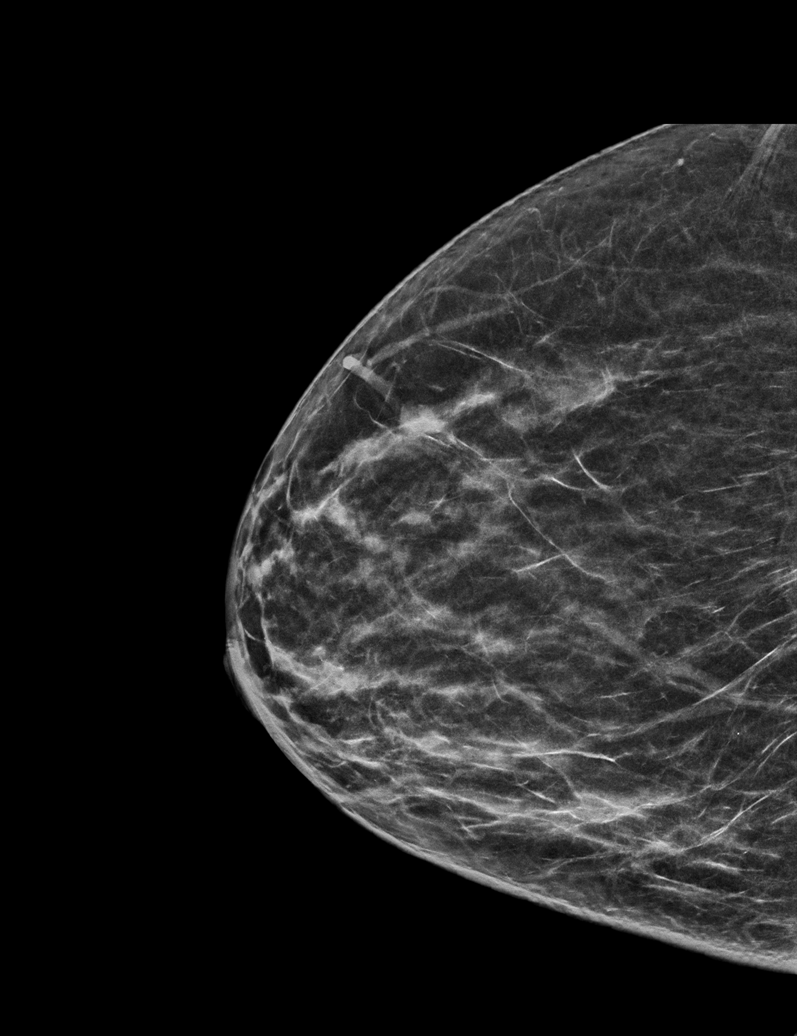

[L CC synth-2D]
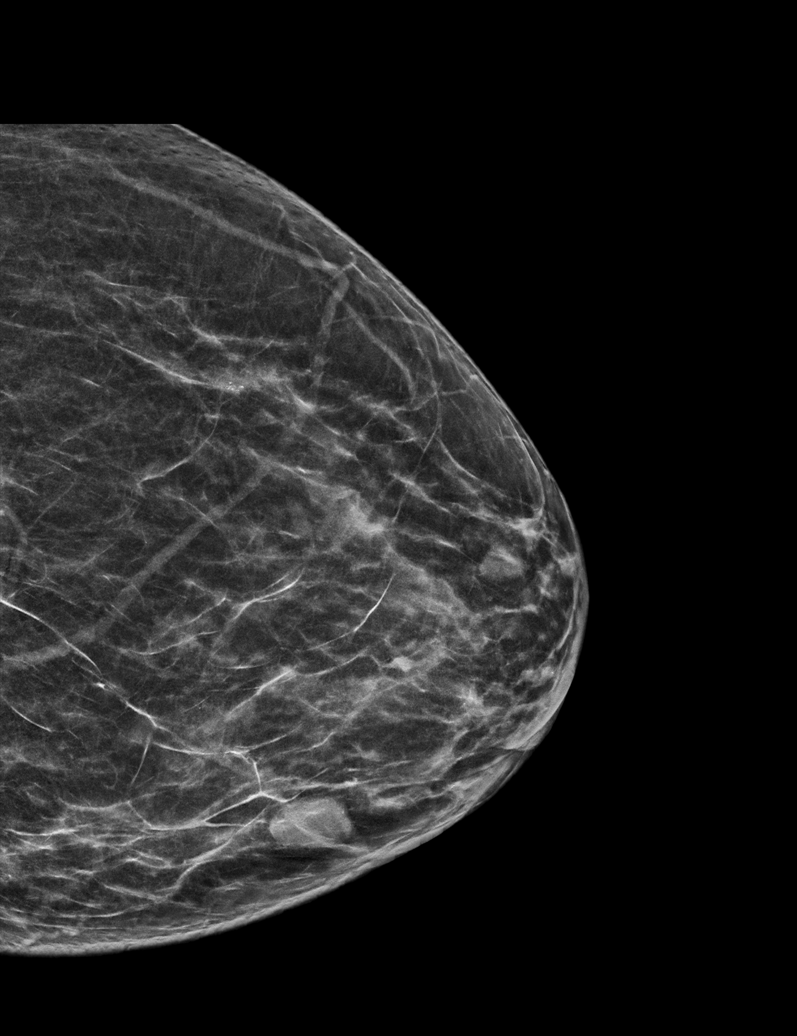

[L MLO synth-2D]
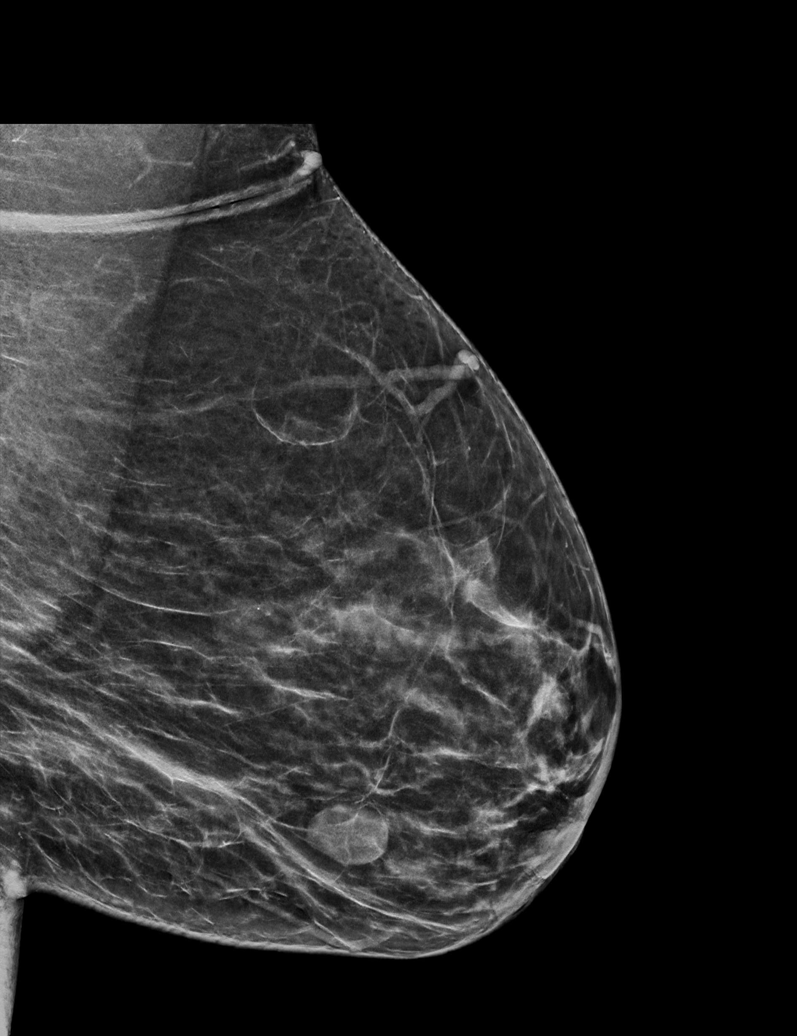

[6 of 26 positions shown; findings below may reference images not displayed]

ACR Breast Density Category b: There are scattered areas of
fibroglandular density.
FINDINGS: 4 mm grouped calcifications in the lateral aspect of the left breast
are unchanged from prior exam dated 07/18/2017. No suspicious mass
or malignant type microcalcifications identified in either breast.
IMPRESSION: Stable benign appearing calcifications in the left breast.

RECOMMENDATION:
Bilateral screening mammogram in 1 year is recommended.

I have discussed the findings and recommendations with the patient.
If applicable, a reminder letter will be sent to the patient
regarding the next appointment.

BI-RADS CATEGORY  2: Benign.

## 2021-12-29 DIAGNOSIS — H40013 Open angle with borderline findings, low risk, bilateral: Secondary | ICD-10-CM | POA: Diagnosis not present

## 2022-03-17 DIAGNOSIS — D175 Benign lipomatous neoplasm of intra-abdominal organs: Secondary | ICD-10-CM | POA: Diagnosis not present

## 2022-03-17 DIAGNOSIS — Z86010 Personal history of colonic polyps: Secondary | ICD-10-CM | POA: Diagnosis not present

## 2022-03-17 DIAGNOSIS — D122 Benign neoplasm of ascending colon: Secondary | ICD-10-CM | POA: Diagnosis not present

## 2022-03-17 DIAGNOSIS — K6289 Other specified diseases of anus and rectum: Secondary | ICD-10-CM | POA: Diagnosis not present

## 2022-03-17 DIAGNOSIS — Z09 Encounter for follow-up examination after completed treatment for conditions other than malignant neoplasm: Secondary | ICD-10-CM | POA: Diagnosis not present

## 2022-03-17 DIAGNOSIS — K649 Unspecified hemorrhoids: Secondary | ICD-10-CM | POA: Diagnosis not present

## 2022-03-17 DIAGNOSIS — D123 Benign neoplasm of transverse colon: Secondary | ICD-10-CM | POA: Diagnosis not present

## 2022-03-25 DIAGNOSIS — I10 Essential (primary) hypertension: Secondary | ICD-10-CM | POA: Diagnosis not present

## 2022-03-25 DIAGNOSIS — Z1231 Encounter for screening mammogram for malignant neoplasm of breast: Secondary | ICD-10-CM | POA: Diagnosis not present

## 2022-03-25 DIAGNOSIS — R21 Rash and other nonspecific skin eruption: Secondary | ICD-10-CM | POA: Diagnosis not present

## 2022-03-25 DIAGNOSIS — Z86010 Personal history of colonic polyps: Secondary | ICD-10-CM | POA: Diagnosis not present

## 2022-03-25 DIAGNOSIS — Z0000 Encounter for general adult medical examination without abnormal findings: Secondary | ICD-10-CM | POA: Diagnosis not present

## 2022-03-25 DIAGNOSIS — E7800 Pure hypercholesterolemia, unspecified: Secondary | ICD-10-CM | POA: Diagnosis not present

## 2022-04-21 ENCOUNTER — Other Ambulatory Visit: Payer: Self-pay | Admitting: Nurse Practitioner

## 2022-04-21 DIAGNOSIS — N6322 Unspecified lump in the left breast, upper inner quadrant: Secondary | ICD-10-CM | POA: Diagnosis not present

## 2022-04-21 DIAGNOSIS — N644 Mastodynia: Secondary | ICD-10-CM

## 2022-05-03 ENCOUNTER — Ambulatory Visit
Admission: RE | Admit: 2022-05-03 | Discharge: 2022-05-03 | Disposition: A | Discharge status: Home / Self Care | Payer: Medicare Other | Source: Ambulatory Visit | Attending: Nurse Practitioner | Admitting: Nurse Practitioner

## 2022-05-03 ENCOUNTER — Ambulatory Visit: Payer: Medicare Other

## 2022-05-03 DIAGNOSIS — N6322 Unspecified lump in the left breast, upper inner quadrant: Secondary | ICD-10-CM | POA: Diagnosis not present

## 2022-05-03 DIAGNOSIS — N644 Mastodynia: Secondary | ICD-10-CM

## 2022-05-03 DIAGNOSIS — R928 Other abnormal and inconclusive findings on diagnostic imaging of breast: Secondary | ICD-10-CM | POA: Diagnosis not present

## 2022-06-07 DIAGNOSIS — I10 Essential (primary) hypertension: Secondary | ICD-10-CM | POA: Diagnosis not present

## 2022-06-30 DIAGNOSIS — H40013 Open angle with borderline findings, low risk, bilateral: Secondary | ICD-10-CM | POA: Diagnosis not present

## 2022-07-22 DIAGNOSIS — R8290 Unspecified abnormal findings in urine: Secondary | ICD-10-CM | POA: Diagnosis not present

## 2022-07-22 DIAGNOSIS — R110 Nausea: Secondary | ICD-10-CM | POA: Diagnosis not present

## 2022-09-23 DIAGNOSIS — M25569 Pain in unspecified knee: Secondary | ICD-10-CM | POA: Diagnosis not present

## 2022-09-23 DIAGNOSIS — Z0000 Encounter for general adult medical examination without abnormal findings: Secondary | ICD-10-CM | POA: Diagnosis not present

## 2022-09-23 DIAGNOSIS — I10 Essential (primary) hypertension: Secondary | ICD-10-CM | POA: Diagnosis not present

## 2022-09-23 DIAGNOSIS — E7800 Pure hypercholesterolemia, unspecified: Secondary | ICD-10-CM | POA: Diagnosis not present

## 2022-12-02 DIAGNOSIS — M1711 Unilateral primary osteoarthritis, right knee: Secondary | ICD-10-CM | POA: Diagnosis not present

## 2022-12-02 DIAGNOSIS — M1712 Unilateral primary osteoarthritis, left knee: Secondary | ICD-10-CM | POA: Diagnosis not present

## 2022-12-02 DIAGNOSIS — M170 Bilateral primary osteoarthritis of knee: Secondary | ICD-10-CM | POA: Diagnosis not present

## 2022-12-30 DIAGNOSIS — H40013 Open angle with borderline findings, low risk, bilateral: Secondary | ICD-10-CM | POA: Diagnosis not present

## 2023-03-27 DIAGNOSIS — Z711 Person with feared health complaint in whom no diagnosis is made: Secondary | ICD-10-CM | POA: Diagnosis not present

## 2023-03-27 DIAGNOSIS — E782 Mixed hyperlipidemia: Secondary | ICD-10-CM | POA: Diagnosis not present

## 2023-03-27 DIAGNOSIS — Z0000 Encounter for general adult medical examination without abnormal findings: Secondary | ICD-10-CM | POA: Diagnosis not present

## 2023-03-27 DIAGNOSIS — Z131 Encounter for screening for diabetes mellitus: Secondary | ICD-10-CM | POA: Diagnosis not present

## 2023-03-27 DIAGNOSIS — I10 Essential (primary) hypertension: Secondary | ICD-10-CM | POA: Diagnosis not present

## 2023-07-20 DIAGNOSIS — R413 Other amnesia: Secondary | ICD-10-CM | POA: Diagnosis not present

## 2023-08-13 NOTE — Progress Notes (Signed)
 Assessment/Plan:   Nichole Bradley is a very pleasant 68 y.o. year old RH female with a history of hypertension, hyperlipidemia, seen today for evaluation of memory loss. MoCA today is 14/30. She had trouble with retrieving simple words, going around them, describing them. Patient is able to participate on ADLs and continues to drive, which needs to be monitored. Etiology is unclear, although there is concern for dementia due to Alzheimer's disease.  Workup is in progress.    Memory Impairment of unclear etiology  MRI brain without contrast to assess for underlying structural abnormality and assess vascular load   Check B12, TSH  Start Memantine 10 mg: Take 1 tablet (10 mg at night) for 2 weeks, then increase to 1 tablet (10 mg) twice a day, side effects discussed.    Continue to control mood as per PCP, patient informed of elevated BP Recommend good control of cardiovascular risk factors [place] up in 3-4 months   Subjective:   The patient is here alone.   How long did patient have memory difficulties? "After retirement in 2019".  Reports some difficulty remembering new information, conversations and names.  Long-term memory is fair according to her. Likes to watch TV, read, go to [place]. She likes to do word finding ( during this visit she could not remember the actual word "Wordfinding", needed clue) repeats oneself?  Endorsed.  She called her daughter 3 times in 1 day about an appointment to have lunch with her. She defended this statement, saying  "just  call to make sure" Disoriented when walking into a room?  Patient denies except occasionally not remembering what patient came to the room for    Leaving objects in unusual places? Denies. " Usually I have a designated spot to leave my stuff". Later she reports that she loses stuff all the time, contradicting herself.  Wandering behavior?  denies .  Any personality changes?  "She gets agitated really easily "per daughter, she  denies. Any history of depression?:  Denies   Hallucinations or paranoia?  Denies   Seizures?  Denies    Any sleep changes?  Sleeps well. Denies vivid dreams, REM behavior or sleepwalking   Sleep apnea?  Denies   Any hygiene concerns?  Denies   Independent of bathing and dressing?  Endorsed  Does the patient needs help with medications? Patient is in charge. She may forget to take her BP med "so I take it later".    Who is in charge of the finances? Patient is in charge, denies any issues.     Any changes in appetite?  Denies     Patient have trouble swallowing? Denies.   Does the patient cook? Yes, but not much, denies forgetting her own recipes   Any kitchen accidents such as leaving the stove on? Denies.   Any history of headaches?   Denies.   Chronic pain ? Denies.   Ambulates with difficulty?  Denies.  Recent falls or head injuries? Denies.   Vision changes? Denies.   Any stroke like symptoms? Denies.   Any tremors?   Denies.   Any anosmia?  Denies.   Any incontinence of urine? Denies.   Any bowel dysfunction? Denies.      Patient lives with her husband and her grand child that goes to college here  History of heavy alcohol intake? Denies.   History of heavy tobacco use?  She chews nicotine gum. Trying to quit, until recently she was smoking 2-3 cig a  day. Family history of dementia? Everyone in my family had different degrees of dementia. She is not aware that any of them has AD Does patient drive? Yes, denies getting lost, drives short distances.    Pertinent labs December 2024 shows TC 214, LDL 144, A1c 5.6 Hospice patient care   Past Medical History:  Diagnosis Date   Back pain    Hypertension      Past Surgical History:  Procedure Laterality Date   CESAREAN SECTION     x2     Allergies  Allergen Reactions   Erythromycin     Gastroenteritis   Penicillins     Syncope    Current Outpatient Medications  Medication Instructions   atenolol-chlorthalidone  (TENORETIC) 100-25 MG tablet 1 tablet, Daily   atorvastatin (LIPITOR) 10 MG tablet 1 tablet Orally Once a day   diltiazem (CARDIZEM CD) 180 mg, Daily   doxazosin (CARDURA) 2 mg, Daily   memantine (NAMENDA) 10 MG tablet Take 1 tablet (10 mg at night) for 2 weeks, then increase to 1 tablet (10 mg) twice a day   potassium chloride SA (KLOR-CON M) 20 MEQ tablet 1 tablet with food Orally Once a day     VITALS:   Vitals:   08/17/23 0753 08/17/23 0754  BP: (!) 150/90 (!) 150/88  Pulse: (!) 59   Resp: 20   SpO2: 99%   Weight: 164 lb (74.4 kg)   Height: 5' 1.5" (1.562 m)       PHYSICAL EXAM   HEENT:  Normocephalic, atraumatic. The superficial temporal arteries are without ropiness or tenderness. Cardiovascular: Regular rate and rhythm. Lungs: Clear to auscultation bilaterally. Neck: There are no carotid bruits noted bilaterally.  NEUROLOGICAL:    08/17/2023    8:00 AM  Montreal Cognitive Assessment   Visuospatial/ Executive (0/5) 1  Naming (0/3) 2  Attention: Read list of digits (0/2) 2  Attention: Read list of letters (0/1) 1  Attention: Serial 7 subtraction starting at 100 (0/3) 2  Language: Repeat phrase (0/2) 1  Language : Fluency (0/1) 0  Abstraction (0/2) 0  Delayed Recall (0/5) 1  Orientation (0/6) 4  Total 14  Adjusted Score (based on education) 14        No data to display           Orientation:  Alert and oriented to person, date no to place. No aphasia or dysarthria. Fund of knowledge is reduced. Recent memory and remote memory impaired.  Attention and concentration are reduced.  Able to name objects and repeat phrases. Delayed recall  1/5 Cranial nerves: There is good facial symmetry. Extraocular muscles are intact and visual fields are full to confrontational testing. Speech is fluent and clear. No tongue deviation. Hearing is intact to conversational tone. Tone: Tone is good throughout. Sensation: Sensation is intact to light touch. Vibration is intact at  the bilateral big toe.  Coordination: The patient has no difficulty with RAM's or FNF bilaterally. Normal finger to nose  Motor: Strength is 5/5 in the bilateral upper and lower extremities. There is no pronator drift. There are no fasciculations noted. DTR's: Deep tendon reflexes are 2/4 bilaterally. Gait and Station: The patient is able to ambulate without difficulty The patient is able to heel toe walk. Gait is cautious and narrow. The patient is able to ambulate in a tandem fashion.       Thank you for allowing us  the opportunity to participate in the care of this nice patient. Please do  not hesitate to contact us  for any questions or concerns.   Total time spent on today's visit was 46 minutes dedicated to this patient today, preparing to see patient, examining the patient, ordering tests and/or medications and counseling the patient, documenting clinical information in the EHR or other health record, independently interpreting results and communicating results to the patient/family, discussing treatment and goals, answering patient's questions and coordinating care.  Cc:  Jannelle Memory, MD (Inactive)  Tex Filbert 08/17/2023 8:57 AM

## 2023-08-16 DIAGNOSIS — H40013 Open angle with borderline findings, low risk, bilateral: Secondary | ICD-10-CM | POA: Diagnosis not present

## 2023-08-17 ENCOUNTER — Ambulatory Visit: Payer: Self-pay | Admitting: Physician Assistant

## 2023-08-17 ENCOUNTER — Encounter: Payer: Self-pay | Admitting: Physician Assistant

## 2023-08-17 ENCOUNTER — Other Ambulatory Visit

## 2023-08-17 ENCOUNTER — Encounter

## 2023-08-17 VITALS — BP 150/88 | HR 59 | Resp 20 | Ht 61.5 in | Wt 164.0 lb

## 2023-08-17 DIAGNOSIS — R413 Other amnesia: Secondary | ICD-10-CM

## 2023-08-17 LAB — TSH: TSH: 4.17 m[IU]/L (ref 0.40–4.50)

## 2023-08-17 LAB — VITAMIN B12: Vitamin B-12: 377 pg/mL (ref 200–1100)

## 2023-08-17 MED ORDER — MEMANTINE HCL 10 MG PO TABS: ORAL_TABLET | ORAL | 3 refills | Status: DC

## 2023-08-17 NOTE — Patient Instructions (Addendum)
 It was a pleasure to see you today at our office.   Recommendations:   MRI of the brain, the radiology office will call you to arrange you appointment  [phone] Check labs today  [address] Follow up in 3  months Recommend visiting the website : " Dementia Success Path" to better understand some behaviors related to memory loss.  For psychiatric meds, mood meds: Please have your primary care physician manage these medications.  If you have any severe symptoms of a stroke, or other severe issues such as confusion,severe chills or fever, etc call 911 or go to the ER as you may need to be evaluated further For guidance regarding [facility] and if placement were needed at the facility, contact Social Worker tel: [phone]  For assessment of decision of mental capacity and competency:  Call Dr. Laverne Potter, geriatric psychiatrist at [phone] Counseling regarding caregiver distress, including caregiver depression, anxiety and issues regarding community resources, adult day care programs, adult living facilities, or memory care questions:  please contact your  Primary Doctor's Social Worker   FOR Memory  decline, memory medications: Call our office [phone]    [email or web address]   RECOMMENDATIONS FOR ALL PATIENTS WITH MEMORY PROBLEMS: 1. Continue to exercise (Recommend 30 minutes of walking everyday, or 3 hours every week) 2. Increase social interactions - continue going to [place] and enjoy social gatherings with friends and family 3. Eat healthy, avoid fried foods and eat more fruits and vegetables 4. Maintain adequate blood pressure, blood sugar, and blood cholesterol level. Reducing the risk of stroke and cardiovascular disease also helps promoting better memory. 5. Avoid stressful situations. Live a simple life and avoid aggravations. Organize your time and prepare for the next day in anticipation. 6.  Sleep well, avoid any interruptions of sleep and avoid any distractions in the bedroom that may interfere with adequate sleep quality 7. Avoid sugar, avoid sweets as there is a strong link between excessive sugar intake, diabetes, and cognitive impairment We discussed the Mediterranean diet, which has been shown to help patients reduce the risk of progressive memory disorders and reduces cardiovascular risk. This includes eating fish, eat fruits and green leafy vegetables, nuts like almonds and hazelnuts, walnuts, and also use olive oil. Avoid fast foods and fried foods as much as possible. Avoid sweets and sugar as sugar use has been linked to worsening of memory function.  There is always a concern of gradual progression of memory problems. If this is the case, then we may need to adjust level of care according to patient needs. Support, both to the patient and caregiver, should then be put into place.       DRIVING: Regarding driving, in patients with progressive memory problems, driving will be impaired. We advise to have someone else do the driving if trouble finding directions or if minor accidents are reported. Independent driving assessment is available to determine safety of driving.   If you are interested in the driving assessment, you can contact the following:  [facility] [employer] [facility] in [place] [phone]  [facility] [phone]  [facility] [phone]  [facility] [phone] or [phone]   FALL PRECAUTIONS: Be cautious when walking. Scan the area for obstacles that may increase the risk of trips and falls. When getting up in the mornings, sit up at the edge of the bed for a few minutes before getting out of bed. Consider elevating the bed at the head end to avoid drop of blood  pressure when getting up. Walk always in a well-lit room (use night lights in the walls). Avoid area rugs or power cords from appliances in the middle of  the walkways. Use a walker or a cane if necessary and consider physical therapy for balance exercise. Get your eyesight checked regularly.  FINANCIAL OVERSIGHT: Supervision, especially oversight when making financial decisions or transactions is also recommended.  HOME SAFETY: Consider the safety of the kitchen when operating appliances like stoves, microwave oven, and blender. Consider having supervision and share cooking responsibilities until no longer able to participate in those. Accidents with firearms and other hazards in the house should be identified and addressed as well.   ABILITY TO BE LEFT ALONE: If patient is unable to contact 911 operator, consider using LifeLine, or when the need is there, arrange for someone to stay with patients. Smoking is a fire hazard, consider supervision or cessation. Risk of wandering should be assessed by caregiver and if detected at any point, supervision and safe proof recommendations should be instituted.  MEDICATION SUPERVISION: Inability to self-administer medication needs to be constantly addressed. Implement a mechanism to ensure safe administration of the medications.      Mediterranean Diet A Mediterranean diet refers to food and lifestyle choices that are based on the traditions of countries located on the [employer] [place]. This way of eating has been shown to help prevent certain conditions and improve outcomes for people who have chronic diseases, like kidney disease and heart disease. What are tips for following this plan? Lifestyle  Cook and eat meals together with your family, when possible. Drink enough fluid to keep your urine clear or pale yellow. Be physically active every day. This includes: Aerobic exercise like running or swimming. Leisure activities like gardening, walking, or housework. Get 7-8 hours of sleep each night. If recommended by your health care provider, drink red wine in moderation. This means 1 glass a day for  nonpregnant women and 2 glasses a day for men. A glass of wine equals 5 oz (150 mL). Reading food labels  Check the serving size of packaged foods. For foods such as rice and pasta, the serving size refers to the amount of cooked product, not dry. Check the total fat in packaged foods. Avoid foods that have saturated fat or trans fats. Check the ingredients list for added sugars, such as corn syrup. Shopping  At the grocery store, buy most of your food from the areas near the walls of the store. This includes: Fresh fruits and vegetables (produce). Grains, beans, nuts, and seeds. Some of these may be available in unpackaged forms or large amounts (in bulk). Fresh seafood. Poultry and eggs. Low-fat dairy products. Buy whole ingredients instead of prepackaged foods. Buy fresh fruits and vegetables in-season from local farmers markets. Buy frozen fruits and vegetables in resealable bags. If you do not have access to quality fresh seafood, buy precooked frozen shrimp or canned fish, such as tuna, salmon, or sardines. Buy small amounts of raw or cooked vegetables, salads, or olives from the deli or salad bar at your store. Stock your pantry so you always have certain foods on hand, such as olive oil, canned tuna, canned tomatoes, rice, pasta, and beans. Cooking  Cook foods with extra-virgin olive oil instead of using butter or other vegetable oils. Have meat as a side dish, and have vegetables or grains as your main dish. This means having meat in small portions or adding small amounts of meat to foods like pasta  or stew. Use beans or vegetables instead of meat in common dishes like chili or lasagna. Experiment with different cooking methods. Try roasting or broiling vegetables instead of steaming or sauteing them. Add frozen vegetables to soups, stews, pasta, or rice. Add nuts or seeds for added healthy fat at each meal. You can add these to yogurt, salads, or vegetable dishes. Marinate fish or  vegetables using olive oil, lemon juice, garlic, and fresh herbs. Meal planning  Plan to eat 1 vegetarian meal one day each week. Try to work up to 2 vegetarian meals, if possible. Eat seafood 2 or more times a week. Have healthy snacks readily available, such as: Vegetable sticks with hummus. Greek yogurt. Fruit and nut trail mix. Eat balanced meals throughout the week. This includes: Fruit: 2-3 servings a day Vegetables: 4-5 servings a day Low-fat dairy: 2 servings a day Fish, poultry, or lean meat: 1 serving a day Beans and legumes: 2 or more servings a week Nuts and seeds: 1-2 servings a day Whole grains: 6-8 servings a day Extra-virgin olive oil: 3-4 servings a day Limit red meat and sweets to only a few servings a month What are my food choices? Mediterranean diet Recommended Grains: Whole-grain pasta. Brown rice. Bulgar wheat. Polenta. Couscous. Whole-wheat bread. Dwyane Glad. Vegetables: Artichokes. Beets. Broccoli. Cabbage. Carrots. Eggplant. Green beans. Chard. Kale. Spinach. Onions. Leeks. Peas. Squash. Tomatoes. Peppers. Radishes. Fruits: Apples. Apricots. Avocado. Berries. Bananas. Cherries. Dates. Figs. Grapes. Lemons. Melon. Oranges. Peaches. Plums. Pomegranate. Meats and other protein foods: Beans. Almonds. Sunflower seeds. Pine nuts. Peanuts. Cod. Salmon. Scallops. Shrimp. Tuna. Tilapia. Clams. Oysters. Eggs. Dairy: Low-fat milk. Cheese. Greek yogurt. Beverages: Water. Red wine. Herbal tea. Fats and oils: Extra virgin olive oil. Avocado oil. Grape seed oil. Sweets and desserts: [place] yogurt with honey. Baked apples. Poached pears. Trail mix. Seasoning and other foods: Basil. Cilantro. Coriander. Cumin. Mint. Parsley. Sage. Rosemary. Tarragon. Garlic. Oregano. Thyme. Pepper. Balsalmic vinegar. Tahini. Hummus. Tomato sauce. Olives. Mushrooms. Limit these Grains: Prepackaged pasta or rice dishes. Prepackaged cereal with added sugar. Vegetables: Deep fried potatoes  (french fries). Fruits: Fruit canned in syrup. Meats and other protein foods: Beef. Pork. Lamb. Poultry with skin. Hot dogs. Helene Loader. Dairy: Ice cream. Sour cream. Whole milk. Beverages: Juice. Sugar-sweetened soft drinks. Beer. Liquor and spirits. Fats and oils: Butter. Canola oil. Vegetable oil. Beef fat (tallow). Lard. Sweets and desserts: Cookies. Cakes. Pies. Candy. Seasoning and other foods: Mayonnaise. Premade sauces and marinades. The items listed may not be a complete list. Talk with your dietitian about what dietary choices are right for you. Summary The Mediterranean diet includes both food and lifestyle choices. Eat a variety of fresh fruits and vegetables, beans, nuts, seeds, and whole grains. Limit the amount of red meat and sweets that you eat. Talk with your health care provider about whether it is safe for you to drink red wine in moderation. This means 1 glass a day for nonpregnant women and 2 glasses a day for men. A glass of wine equals 5 oz (150 mL). This information is not intended to replace advice given to you by your health care provider. Make sure you discuss any questions you have with your health care provider. Document Released: 11/05/2015 Document Revised: 12/08/2015 Document Reviewed: 11/05/2015 Elsevier Interactive Patient Education  2017 [employer].

## 2023-08-18 ENCOUNTER — Ambulatory Visit: Payer: Self-pay | Admitting: Physician Assistant

## 2023-08-27 ENCOUNTER — Ambulatory Visit
Admission: RE | Admit: 2023-08-27 | Discharge: 2023-08-27 | Disposition: A | Discharge status: Home / Self Care | Source: Ambulatory Visit | Attending: Physician Assistant | Admitting: Physician Assistant

## 2023-08-27 DIAGNOSIS — R413 Other amnesia: Secondary | ICD-10-CM | POA: Diagnosis not present

## 2023-09-14 ENCOUNTER — Encounter: Payer: Self-pay | Admitting: Physician Assistant

## 2023-09-25 DIAGNOSIS — E7800 Pure hypercholesterolemia, unspecified: Secondary | ICD-10-CM | POA: Diagnosis not present

## 2023-09-25 DIAGNOSIS — R413 Other amnesia: Secondary | ICD-10-CM | POA: Diagnosis not present

## 2023-09-25 DIAGNOSIS — E782 Mixed hyperlipidemia: Secondary | ICD-10-CM | POA: Diagnosis not present

## 2023-09-25 DIAGNOSIS — I10 Essential (primary) hypertension: Secondary | ICD-10-CM | POA: Diagnosis not present

## 2023-09-25 DIAGNOSIS — E876 Hypokalemia: Secondary | ICD-10-CM | POA: Diagnosis not present

## 2023-10-04 DIAGNOSIS — M170 Bilateral primary osteoarthritis of knee: Secondary | ICD-10-CM | POA: Diagnosis not present

## 2023-10-04 DIAGNOSIS — M1712 Unilateral primary osteoarthritis, left knee: Secondary | ICD-10-CM | POA: Diagnosis not present

## 2023-10-04 DIAGNOSIS — M1711 Unilateral primary osteoarthritis, right knee: Secondary | ICD-10-CM | POA: Diagnosis not present

## 2023-10-26 DIAGNOSIS — E782 Mixed hyperlipidemia: Secondary | ICD-10-CM | POA: Diagnosis not present

## 2023-10-26 DIAGNOSIS — E7800 Pure hypercholesterolemia, unspecified: Secondary | ICD-10-CM | POA: Diagnosis not present

## 2023-10-26 DIAGNOSIS — I10 Essential (primary) hypertension: Secondary | ICD-10-CM | POA: Diagnosis not present

## 2023-11-26 DIAGNOSIS — I10 Essential (primary) hypertension: Secondary | ICD-10-CM | POA: Diagnosis not present

## 2023-11-26 DIAGNOSIS — E7800 Pure hypercholesterolemia, unspecified: Secondary | ICD-10-CM | POA: Diagnosis not present

## 2023-11-26 DIAGNOSIS — E782 Mixed hyperlipidemia: Secondary | ICD-10-CM | POA: Diagnosis not present

## 2023-12-06 ENCOUNTER — Ambulatory Visit: Admitting: Physician Assistant

## 2023-12-26 DIAGNOSIS — E7800 Pure hypercholesterolemia, unspecified: Secondary | ICD-10-CM | POA: Diagnosis not present

## 2023-12-26 DIAGNOSIS — I10 Essential (primary) hypertension: Secondary | ICD-10-CM | POA: Diagnosis not present

## 2023-12-26 DIAGNOSIS — E782 Mixed hyperlipidemia: Secondary | ICD-10-CM | POA: Diagnosis not present

## 2024-01-04 DIAGNOSIS — L02818 Cutaneous abscess of other sites: Secondary | ICD-10-CM | POA: Diagnosis not present

## 2024-01-04 DIAGNOSIS — I10 Essential (primary) hypertension: Secondary | ICD-10-CM | POA: Diagnosis not present

## 2024-01-16 DIAGNOSIS — H40013 Open angle with borderline findings, low risk, bilateral: Secondary | ICD-10-CM | POA: Diagnosis not present

## 2024-01-17 DIAGNOSIS — L02818 Cutaneous abscess of other sites: Secondary | ICD-10-CM | POA: Diagnosis not present

## 2024-01-21 NOTE — Progress Notes (Signed)
 Assessment/Plan:   Memory Impairment***  Nichole Bradley is a very pleasant 68 y.o. RH female with a history ofhypertension, hyperlipidemia, B12 deficiency seen today in follow up for memory loss. Patient is currently on memantine  10 mg bid, tolerating well. .  Patient is able to participate on ADLs***and to drive without significant difficulties.  Mood is***    Follow up in   months. Continue*** Replenish B12 Recommend good control of her cardiovascular risk factors Continue to control mood as per PCP     Subjective:    This patient is here alone.  Previous records as well as any outside records available were reviewed prior to todays visit. Patient was last seen on 08/20/23 with [facility] 14/30 ***   Any changes in memory since last visit?  repeats oneself?  Endorsed Disoriented when walking into a room? Denies ***  Leaving objects?  May misplace things but not in unusual places***  Wandering behavior?  denies   Any personality changes since last visit?  Denies.   Any worsening depression?:  Denies.   Hallucinations or paranoia?  Denies.   Seizures? denies    Any sleep changes?  Denies vivid dreams, REM behavior or sleepwalking   Sleep apnea?   Denies.   Any hygiene concerns? Denies.  Independent of bathing and dressing?  Endorsed  Does the patient needs help with medications?   is in charge *** Who is in charge of the finances?   is in charge   *** Any changes in appetite?  denies ***   Patient have trouble swallowing? Denies.   Does the patient cook? No Any headaches?   denies   Any vision changes?*** Chronic back pain  denies   Ambulates with difficulty? Denies.  *** Recent falls or head injuries? Denies.     Unilateral weakness, numbness or tingling? denies   Any tremors?  Denies  *** Any anosmia?  Denies   Any incontinence of urine?  Endorsed***  Any bowel dysfunction?   Denies      Patient lives   *** Does the patient drive? No longer drives ***  Initial  visit 07/2023   How long did patient have memory difficulties? After retirement in 2019.  Reports some difficulty remembering new information, conversations and names.  Long-term memory is fair according to her. Likes to watch TV, read, go to [place]. She likes to do word finding ( during this visit she could not remember the actual word Wordfinding, needed clue) repeats oneself?  Endorsed.  She called her daughter 3 times in 1 day about an appointment to have lunch with her. She defended this statement, saying  just  call to make sure Disoriented when walking into a room?  Patient denies except occasionally not remembering what patient came to the room for    Leaving objects in unusual places? Denies.  Usually I have a designated spot to leave my stuff. Later she reports that she loses stuff all the time, contradicting herself.  Wandering behavior?  denies .  Any personality changes?  She gets agitated really easily per daughter, she denies. Any history of depression?:  Denies   Hallucinations or paranoia?  Denies   Seizures?  Denies    Any sleep changes?  Sleeps well. Denies vivid dreams, REM behavior or sleepwalking   Sleep apnea?  Denies   Any hygiene concerns?  Denies   Independent of bathing and dressing?  Endorsed  Does the patient needs help with medications? Patient is in charge.  She may forget to take her BP med so I take it later.    Who is in charge of the finances? Patient is in charge, denies any issues.     Any changes in appetite?  Denies     Patient have trouble swallowing? Denies.   Does the patient cook? Yes, but not much, denies forgetting her own recipes   Any kitchen accidents such as leaving the stove on? Denies.   Any history of headaches?   Denies.   Chronic pain ? Denies.   Ambulates with difficulty?  Denies.  Recent falls or head injuries? Denies.   Vision changes? Denies.   Any stroke like symptoms? Denies.   Any tremors?   Denies.   Any anosmia?   Denies.   Any incontinence of urine? Denies.   Any bowel dysfunction? Denies.      Patient lives with her husband and her grand child that goes to college here  History of heavy alcohol intake? Denies.   History of heavy tobacco use?  She chews nicotine gum. Trying to quit, until recently she was smoking 2-3 cig a day. Family history of dementia? Everyone in my family had different degrees of dementia. She is not aware that any of them has AD Does patient drive? Yes, denies getting lost, drives short distances.     MRI brain 08/2023, personally reviewed remarkable for mild generalized cerebral atrophy, chronic SVD, no acute intracranial findings  PREVIOUS MEDICATIONS:   CURRENT MEDICATIONS:  Outpatient Encounter Medications as of 01/22/2024  Medication Sig   atenolol-chlorthalidone (TENORETIC) 100-25 MG tablet Take 1 tablet by mouth daily.   atorvastatin (LIPITOR) 10 MG tablet 1 tablet Orally Once a day   diltiazem (CARDIZEM CD) 180 MG 24 hr capsule Take 180 mg by mouth daily.   doxazosin (CARDURA) 2 MG tablet Take 2 mg by mouth daily.   memantine  (NAMENDA ) 10 MG tablet Take 1 tablet (10 mg at night) for 2 weeks, then increase to 1 tablet (10 mg) twice a day   potassium chloride SA (KLOR-CON M) 20 MEQ tablet 1 tablet with food Orally Once a day   No facility-administered encounter medications on file as of 01/22/2024.        No data to display            08/17/2023    8:00 AM  Montreal Cognitive Assessment   Visuospatial/ Executive (0/5) 1  Naming (0/3) 2  Attention: Read list of digits (0/2) 2  Attention: Read list of letters (0/1) 1  Attention: Serial 7 subtraction starting at 100 (0/3) 2  Language: Repeat phrase (0/2) 1  Language : Fluency (0/1) 0  Abstraction (0/2) 0  Delayed Recall (0/5) 1  Orientation (0/6) 4  Total 14  Adjusted Score (based on education) 14    Objective:     PHYSICAL EXAMINATION:    VITALS:  There were no vitals filed for this  visit.  GEN:  The patient appears stated age and is in NAD. HEENT:  Normocephalic, atraumatic.   Neurological examination:  General: NAD, well-groomed, appears stated age. Orientation: The patient is alert. Oriented to person, place and date Cranial nerves: There is good facial symmetry.The speech is fluent and clear, tangential. No aphasia or dysarthria. Fund of knowledge is reduced. Recent and remote memory are impaired. Attention and concentration are reduced. Able to name objects and repeat phrases.  Hearing is intact to conversational tone. *** Sensation: Sensation is intact to light touch throughout Motor: Strength is  at least antigravity x4. DTR's 2/4 in UE/LE     Movement examination: Tone: There is normal tone in the UE/LE Abnormal movements:  no tremor.  No myoclonus.  No asterixis.   Coordination:  There is no decremation with RAM's. Normal finger to nose  Gait and Station: The patient has no*** difficulty arising out of a deep-seated chair without the use of the hands. The patient's stride length is good.  Gait is cautious and narrow.    Thank you for allowing us  the opportunity to participate in the care of this nice patient. Please do not hesitate to contact us  for any questions or concerns.   Total time spent on today's visit was *** minutes dedicated to this patient today, preparing to see patient, examining the patient, ordering tests and/or medications and counseling the patient, documenting clinical information in the EHR or other health record, independently interpreting results and communicating results to the patient/family, discussing treatment and goals, answering patient's questions and coordinating care.  Cc:  Auston Opal, DO  [facility] 01/21/2024 4:07 PM

## 2024-01-22 ENCOUNTER — Telehealth: Payer: Self-pay | Admitting: Physician Assistant

## 2024-01-22 ENCOUNTER — Ambulatory Visit: Admitting: Physician Assistant

## 2024-01-22 VITALS — BP 109/74 | HR 80 | Resp 20 | Wt 159.0 lb

## 2024-01-22 DIAGNOSIS — R413 Other amnesia: Secondary | ICD-10-CM | POA: Diagnosis not present

## 2024-01-22 MED ORDER — MEMANTINE HCL 10 MG PO TABS: ORAL_TABLET | ORAL | 3 refills | Status: AC

## 2024-01-22 NOTE — Telephone Encounter (Signed)
 I spoke to her daughter and appt is today. Patient thanked me for calling.

## 2024-01-22 NOTE — Telephone Encounter (Signed)
 Pt's daughter called  in this afternoon  and she is concern with her mother  (pt) behavior. Daughter is wanting to discuss issue  she is seeing from Mom. Thanks

## 2024-01-22 NOTE — Telephone Encounter (Signed)
 I tried to call daughter, mom answered she has an appt at 3:00 today.

## 2024-01-24 DIAGNOSIS — L02818 Cutaneous abscess of other sites: Secondary | ICD-10-CM | POA: Diagnosis not present

## 2024-03-27 ENCOUNTER — Other Ambulatory Visit: Payer: Self-pay | Admitting: Family Medicine

## 2024-03-27 DIAGNOSIS — Z1231 Encounter for screening mammogram for malignant neoplasm of breast: Secondary | ICD-10-CM

## 2024-04-23 ENCOUNTER — Ambulatory Visit

## 2024-05-07 ENCOUNTER — Ambulatory Visit

## 2024-07-22 ENCOUNTER — Ambulatory Visit: Admitting: Physician Assistant
